# Patient Record
Sex: Female | Born: 1960 | Race: Black or African American | Hispanic: No | Marital: Single | Smoking: Never smoker
Health system: Southern US, Community
[De-identification: ages and names within clinical notes are randomized; demographics above are authoritative.]

## PROBLEM LIST (undated history)

## (undated) DIAGNOSIS — N643 Galactorrhea not associated with childbirth: Secondary | ICD-10-CM

## (undated) DIAGNOSIS — M1990 Unspecified osteoarthritis, unspecified site: Secondary | ICD-10-CM

## (undated) DIAGNOSIS — I639 Cerebral infarction, unspecified: Secondary | ICD-10-CM

## (undated) DIAGNOSIS — N644 Mastodynia: Secondary | ICD-10-CM

## (undated) HISTORY — DX: Mastodynia: N64.4

## (undated) HISTORY — DX: Cerebral infarction, unspecified: I63.9

## (undated) HISTORY — DX: Galactorrhea not associated with childbirth: N64.3

## (undated) HISTORY — DX: Unspecified osteoarthritis, unspecified site: M19.90

---

## 2001-07-31 ENCOUNTER — Other Ambulatory Visit: Admission: RE | Admit: 2001-07-31 | Discharge: 2001-07-31 | Payer: Self-pay | Admitting: Obstetrics and Gynecology

## 2001-08-07 ENCOUNTER — Encounter: Admission: RE | Admit: 2001-08-07 | Discharge: 2001-08-07 | Payer: Self-pay | Admitting: *Deleted

## 2001-08-07 ENCOUNTER — Encounter: Payer: Self-pay | Admitting: *Deleted

## 2002-05-04 HISTORY — PX: OTHER SURGICAL HISTORY: SHX169

## 2002-05-04 HISTORY — PX: HYSTEROSCOPY WITH D & C: SHX1775

## 2002-08-25 ENCOUNTER — Other Ambulatory Visit: Admission: RE | Admit: 2002-08-25 | Discharge: 2002-08-25 | Payer: Self-pay | Admitting: Obstetrics and Gynecology

## 2002-09-03 ENCOUNTER — Encounter: Payer: Self-pay | Admitting: *Deleted

## 2002-09-03 ENCOUNTER — Encounter: Admission: RE | Admit: 2002-09-03 | Discharge: 2002-09-03 | Payer: Self-pay | Admitting: *Deleted

## 2002-10-04 ENCOUNTER — Ambulatory Visit (HOSPITAL_COMMUNITY): Admission: RE | Admit: 2002-10-04 | Discharge: 2002-10-04 | Payer: Self-pay | Admitting: Obstetrics and Gynecology

## 2002-10-04 ENCOUNTER — Encounter (INDEPENDENT_AMBULATORY_CARE_PROVIDER_SITE_OTHER): Payer: Self-pay | Admitting: Specialist

## 2003-06-01 ENCOUNTER — Other Ambulatory Visit: Admission: RE | Admit: 2003-06-01 | Discharge: 2003-06-01 | Payer: Self-pay | Admitting: *Deleted

## 2003-09-19 ENCOUNTER — Encounter: Admission: RE | Admit: 2003-09-19 | Discharge: 2003-09-19 | Payer: Self-pay | Admitting: *Deleted

## 2003-09-19 ENCOUNTER — Other Ambulatory Visit: Admission: RE | Admit: 2003-09-19 | Discharge: 2003-09-19 | Payer: Self-pay | Admitting: Obstetrics and Gynecology

## 2004-01-06 ENCOUNTER — Other Ambulatory Visit: Admission: RE | Admit: 2004-01-06 | Discharge: 2004-01-06 | Payer: Self-pay | Admitting: Obstetrics and Gynecology

## 2004-10-11 ENCOUNTER — Encounter: Admission: RE | Admit: 2004-10-11 | Discharge: 2004-10-11 | Payer: Self-pay | Admitting: *Deleted

## 2004-11-19 ENCOUNTER — Other Ambulatory Visit: Admission: RE | Admit: 2004-11-19 | Discharge: 2004-11-19 | Payer: Self-pay | Admitting: Obstetrics and Gynecology

## 2004-12-03 ENCOUNTER — Encounter: Admission: RE | Admit: 2004-12-03 | Discharge: 2004-12-03 | Payer: Self-pay | Admitting: Obstetrics and Gynecology

## 2005-06-06 ENCOUNTER — Encounter: Admission: RE | Admit: 2005-06-06 | Discharge: 2005-06-06 | Payer: Self-pay | Admitting: Obstetrics and Gynecology

## 2005-11-21 ENCOUNTER — Other Ambulatory Visit: Admission: RE | Admit: 2005-11-21 | Discharge: 2005-11-21 | Payer: Self-pay | Admitting: Internal Medicine

## 2005-11-26 ENCOUNTER — Encounter: Admission: RE | Admit: 2005-11-26 | Discharge: 2005-11-26 | Payer: Self-pay | Admitting: Obstetrics & Gynecology

## 2006-05-09 ENCOUNTER — Other Ambulatory Visit: Admission: RE | Admit: 2006-05-09 | Discharge: 2006-05-09 | Payer: Self-pay | Admitting: Obstetrics & Gynecology

## 2006-11-25 ENCOUNTER — Other Ambulatory Visit: Admission: RE | Admit: 2006-11-25 | Discharge: 2006-11-25 | Payer: Self-pay | Admitting: Obstetrics and Gynecology

## 2006-12-01 ENCOUNTER — Encounter: Admission: RE | Admit: 2006-12-01 | Discharge: 2006-12-01 | Payer: Self-pay | Admitting: Obstetrics and Gynecology

## 2007-05-27 DIAGNOSIS — I639 Cerebral infarction, unspecified: Secondary | ICD-10-CM

## 2007-05-27 HISTORY — DX: Cerebral infarction, unspecified: I63.9

## 2007-06-17 ENCOUNTER — Ambulatory Visit: Payer: Self-pay | Admitting: Cardiology

## 2007-12-25 ENCOUNTER — Encounter: Admission: RE | Admit: 2007-12-25 | Discharge: 2007-12-25 | Payer: Self-pay | Admitting: Obstetrics and Gynecology

## 2007-12-25 ENCOUNTER — Other Ambulatory Visit: Admission: RE | Admit: 2007-12-25 | Discharge: 2007-12-25 | Payer: Self-pay | Admitting: Obstetrics and Gynecology

## 2008-12-27 ENCOUNTER — Encounter: Admission: RE | Admit: 2008-12-27 | Discharge: 2008-12-27 | Payer: Self-pay | Admitting: Obstetrics and Gynecology

## 2010-02-08 ENCOUNTER — Encounter
Admission: RE | Admit: 2010-02-08 | Discharge: 2010-02-08 | Payer: Self-pay | Source: Home / Self Care | Attending: Obstetrics and Gynecology | Admitting: Obstetrics and Gynecology

## 2010-03-17 ENCOUNTER — Encounter: Payer: Self-pay | Admitting: Obstetrics and Gynecology

## 2010-07-13 NOTE — Op Note (Signed)
   NAME:  Alison Castillo, Alison Castillo                         ACCOUNT NO.:  [id number]   MEDICAL RECORD NO.:  [id number]                   PATIENT TYPE:  OUT   LOCATION:  [facility]                                  FACILITY:  [facility]   PHYSICIAN:  Cynthia P. Romine, M.D.             DATE OF BIRTH:  02-06-1961   DATE OF PROCEDURE:  10/04/2002  DATE OF DISCHARGE:                                 OPERATIVE REPORT   PREOPERATIVE DIAGNOSES:  1. Menorrhagia,  2. Known endometrial polyp.  3. Known uterine fibroids.   POSTOPERATIVE DIAGNOSES:  Pending.   PROCEDURE:  Hysteroscopic resection of endometrial polyps, ________.   SURGEON:  Cynthia P. Romine, M.D.   ANESTHESIA:  General by LMA.   ESTIMATED BLOOD LOSS:  Minimal.   COMPLICATIONS:  None.   SORBITOL DEFICIT:  80 cc.   PROCEDURE:  The patient was taken to the operating room, and after the  induction of adequate general anesthesia, was placed in the dorsal lithotomy  position and prepped and draped in the usual fashion.  The bladder was  drained with a red rubber catheter.  We grasped the introitus with a single  tooth tenaculum and a uterine clamp at 5 cm with fingertip dilation up to 31  Pratt.  The operative hysteroscope was used. Sorbitol was used as a  distention medium.  The pump was set as a pressure of 80 mmHg.  Hysteroscopy  revealed the known endometrial polyp.  This was removed with a double loop  cautery without difficulty.  The remainder of the endometrial cavity  appeared clear.  The fundus, the ligaments, and the cervix were clear after  resection of the polyp.  The hysteroscope was removed.  Sharp curettage was  carried out, specimen was sent to pathology.  The hysteroscope was re-  introduced and hysteroscopy was again carried out again revealing the  endometrial cavity to be clean.  Hysteroscope was removed.  The procedure  was terminated.  The patient tolerated it well and was transferred in  satisfactory condition to  post-anesthesia recovery.                                               Cynthia P. Romine, M.D.    CPR/MEDQ  D:  10/04/2002  T:  10/04/2002  Job:  [id number]

## 2011-01-07 ENCOUNTER — Other Ambulatory Visit: Payer: Self-pay | Admitting: Obstetrics and Gynecology

## 2011-01-07 DIAGNOSIS — Z1231 Encounter for screening mammogram for malignant neoplasm of breast: Secondary | ICD-10-CM

## 2011-01-28 ENCOUNTER — Other Ambulatory Visit: Payer: Self-pay | Admitting: Family Medicine

## 2011-01-28 DIAGNOSIS — R9389 Abnormal findings on diagnostic imaging of other specified body structures: Secondary | ICD-10-CM

## 2011-02-01 ENCOUNTER — Ambulatory Visit
Admission: RE | Admit: 2011-02-01 | Discharge: 2011-02-01 | Disposition: A | Discharge status: Home / Self Care | Payer: BC Managed Care – PPO | Source: Ambulatory Visit | Attending: Family Medicine | Admitting: Family Medicine

## 2011-02-01 DIAGNOSIS — R9389 Abnormal findings on diagnostic imaging of other specified body structures: Secondary | ICD-10-CM

## 2011-02-01 MED ORDER — IOHEXOL 300 MG/ML  SOLN
75.0000 mL | Freq: Once | INTRAMUSCULAR | Status: AC | PRN
  Administered 2011-02-01: 75 mL via INTRAVENOUS

## 2011-02-13 ENCOUNTER — Other Ambulatory Visit: Payer: Self-pay | Admitting: Family Medicine

## 2011-02-13 DIAGNOSIS — E041 Nontoxic single thyroid nodule: Secondary | ICD-10-CM

## 2011-03-01 ENCOUNTER — Ambulatory Visit
Admission: RE | Admit: 2011-03-01 | Discharge: 2011-03-01 | Disposition: A | Discharge status: Home / Self Care | Payer: BC Managed Care – PPO | Source: Ambulatory Visit | Attending: Obstetrics and Gynecology | Admitting: Obstetrics and Gynecology

## 2011-03-01 DIAGNOSIS — Z1231 Encounter for screening mammogram for malignant neoplasm of breast: Secondary | ICD-10-CM

## 2012-03-02 ENCOUNTER — Other Ambulatory Visit: Payer: Self-pay | Admitting: Obstetrics and Gynecology

## 2012-03-02 DIAGNOSIS — Z1231 Encounter for screening mammogram for malignant neoplasm of breast: Secondary | ICD-10-CM

## 2012-03-25 ENCOUNTER — Ambulatory Visit: Payer: BC Managed Care – PPO

## 2012-04-24 ENCOUNTER — Ambulatory Visit: Payer: BC Managed Care – PPO

## 2012-04-24 ENCOUNTER — Ambulatory Visit
Admission: RE | Admit: 2012-04-24 | Discharge: 2012-04-24 | Disposition: A | Discharge status: Home / Self Care | Payer: BC Managed Care – PPO | Source: Ambulatory Visit | Attending: Obstetrics and Gynecology | Admitting: Obstetrics and Gynecology

## 2012-09-18 ENCOUNTER — Encounter: Payer: Self-pay | Admitting: *Deleted

## 2012-09-21 ENCOUNTER — Telehealth: Payer: Self-pay | Admitting: Nurse Practitioner

## 2012-09-21 ENCOUNTER — Encounter: Payer: Self-pay | Admitting: Nurse Practitioner

## 2012-09-21 ENCOUNTER — Ambulatory Visit (INDEPENDENT_AMBULATORY_CARE_PROVIDER_SITE_OTHER): Payer: BC Managed Care – PPO | Admitting: Nurse Practitioner

## 2012-09-21 VITALS — BP 128/72 | HR 74 | Temp 98.6°F | Resp 12 | Ht 68.0 in | Wt 167.4 lb

## 2012-09-21 DIAGNOSIS — N390 Urinary tract infection, site not specified: Secondary | ICD-10-CM

## 2012-09-21 DIAGNOSIS — R109 Unspecified abdominal pain: Secondary | ICD-10-CM

## 2012-09-21 LAB — POCT URINALYSIS DIPSTICK
Protein, UA: POSITIVE
Urobilinogen, UA: NEGATIVE
pH, UA: 6.5

## 2012-09-21 LAB — CBC
HCT: 37.5 % (ref 36.0–46.0)
Platelets: 188 10*3/uL (ref 150–400)
WBC: 6.1 10*3/uL (ref 4.0–10.5)

## 2012-09-21 MED ORDER — CIPROFLOXACIN HCL 500 MG PO TABS: 500.0000 mg | ORAL_TABLET | Freq: Two times a day (BID) | ORAL | Status: DC

## 2012-09-21 NOTE — Patient Instructions (Signed)
Urinary Tract Infection  Urinary tract infections (UTIs) can develop anywhere along your urinary tract. Your urinary tract is your body's drainage system for removing wastes and extra water. Your urinary tract includes two kidneys, two ureters, a bladder, and a urethra. Your kidneys are a pair of bean-shaped organs. Each kidney is about the size of your fist. They are located below your ribs, one on each side of your spine.  CAUSES  Infections are caused by microbes, which are microscopic organisms, including fungi, viruses, and bacteria. These organisms are so small that they can only be seen through a microscope. Bacteria are the microbes that most commonly cause UTIs.  SYMPTOMS   Symptoms of UTIs may vary by age and gender of the patient and by the location of the infection. Symptoms in young women typically include a frequent and intense urge to urinate and a painful, burning feeling in the bladder or urethra during urination. Older women and men are more likely to be tired, shaky, and weak and have muscle aches and abdominal pain. A fever may mean the infection is in your kidneys. Other symptoms of a kidney infection include pain in your back or sides below the ribs, nausea, and vomiting.  DIAGNOSIS  To diagnose a UTI, your caregiver will ask you about your symptoms. Your caregiver also will ask to provide a urine sample. The urine sample will be tested for bacteria and white blood cells. White blood cells are made by your body to help fight infection.  TREATMENT   Typically, UTIs can be treated with medication. Because most UTIs are caused by a bacterial infection, they usually can be treated with the use of antibiotics. The choice of antibiotic and length of treatment depend on your symptoms and the type of bacteria causing your infection.  HOME CARE INSTRUCTIONS   If you were prescribed antibiotics, take them exactly as your caregiver instructs you. Finish the medication even if you feel better after you  have only taken some of the medication.   Drink enough water and fluids to keep your urine clear or pale yellow.   Avoid caffeine, tea, and carbonated beverages. They tend to irritate your bladder.   Empty your bladder often. Avoid holding urine for long periods of time.   Empty your bladder before and after sexual intercourse.   After a bowel movement, women should cleanse from front to back. Use each tissue only once.  SEEK MEDICAL CARE IF:    You have back pain.   You develop a fever.   Your symptoms do not begin to resolve within 3 days.  SEEK IMMEDIATE MEDICAL CARE IF:    You have severe back pain or lower abdominal pain.   You develop chills.   You have nausea or vomiting.   You have continued burning or discomfort with urination.  MAKE SURE YOU:    Understand these instructions.   Will watch your condition.   Will get help right away if you are not doing well or get worse.  Document Released: 11/21/2004 Document Revised: 08/13/2011 Document Reviewed: 03/22/2011  Exit[employer] Patient Information 2014 [employer].

## 2012-09-21 NOTE — Telephone Encounter (Signed)
Patient was called and left a message that stat labs were normal but that if she got worse to call back.  Dr. Farrel Gobble on call this pm.

## 2012-09-21 NOTE — Progress Notes (Signed)
Subjective:     Patient ID: Alison Castillo, female   DOB: 1960/04/13, 52 y.o.   MRN: [id number] 52 yo S AA Fe with history of symptoms of urinary urgency and frequency since 7/23. No dysuria but having bladder 'spasm'. No fever or chills. Today with nausea and vomiting X 4. Normal breakfast without symptoms. Then this afternoon no appetite then had vomiting. LMP 09/06/12 flow for 4-5 days. Condoms for birth control. Denies vaginal symptoms. No history of renal calculi.  Pelvic Pain The patient's primary symptoms include pelvic pain. The patient's pertinent negatives include no vaginal discharge. Associated symptoms include abdominal pain, back pain, flank pain, frequency, nausea and vomiting. Pertinent negatives include no chills, constipation, diarrhea, dysuria or fever. She has tried nothing for the symptoms. She is sexually active. No, her partner does not have an STD. She uses condoms for contraception. Her menstrual history has been regular. (Uterine fibroids with last PUS 04/17/11 multiple fibroids.)      Review of Systems  Constitutional: Positive for appetite change and fatigue. Negative for fever and chills.  Respiratory: Negative.   Cardiovascular: Negative.   Gastrointestinal: Positive for nausea, vomiting, abdominal pain and abdominal distention. Negative for diarrhea, constipation and blood in stool.  Endocrine: Negative.   Genitourinary: Positive for frequency, flank pain and pelvic pain. Negative for dysuria, vaginal bleeding and vaginal discharge.  Musculoskeletal: Positive for back pain.  Neurological: Negative.   Psychiatric/Behavioral: Negative.        Objective:   Physical Exam  Constitutional: She is oriented to person, place, and time. She appears well-developed and well-nourished. No distress.  Cardiovascular: Normal rate.   Pulmonary/Chest: Effort normal.  Abdominal: Soft. Bowel sounds are normal. She exhibits distension. She exhibits no mass. There is no tenderness.  There is no rebound and no guarding.  Slight distention but soft.  Genitourinary:  No lesions, normal vaginal discharge. Uterus about 13 wk size. No pain on bimanual.   Unable to elicit any pain on pelvic.  Musculoskeletal: Normal range of motion.  Neurological: She is alert and oriented to person, place, and time.  Skin: She is not diaphoretic.  Psychiatric: She has a normal mood and affect. Her behavior is normal. Judgment and thought content normal.   Urine : trace RBC and + protein    Assessment:     R/O UTI N / V today will get stat CBC    Plan:     Stat CBC and call results to me Cipro 500 mg bid # 14 Urine Culture and will follow     5:45 pm stat CBC labs called to me: WBC 6.1; Hgb 12.5; platelets 188. Patient was called and not available so left a voice mail that labs were OK but if symptoms worsen to let us know that Dr. Farrel Gobble was on call. spoke to Dr. Farrel Gobble about patient.

## 2012-09-22 LAB — URINE CULTURE: Colony Count: NO GROWTH

## 2012-09-22 NOTE — Progress Notes (Signed)
Encounter reviewed by Dr. Brook Silva.  

## 2012-09-25 ENCOUNTER — Telehealth: Payer: Self-pay | Admitting: *Deleted

## 2012-09-25 NOTE — Telephone Encounter (Signed)
Pt is aware of negative urine culture results and states she feels much better and is without pain.

## 2012-09-25 NOTE — Telephone Encounter (Signed)
Message copied by Osie Bond on Fri Sep 25, 2012  9:59 AM ------      Message from: Ria Comment R      Created: Thu Sep 24, 2012  5:15 PM       Let patient know that urine culture was negative - get a progress report. ------

## 2012-12-10 ENCOUNTER — Other Ambulatory Visit: Payer: Self-pay

## 2012-12-10 DIAGNOSIS — Z1231 Encounter for screening mammogram for malignant neoplasm of breast: Secondary | ICD-10-CM

## 2013-04-26 ENCOUNTER — Ambulatory Visit: Payer: Self-pay | Admitting: Gynecology

## 2013-04-26 ENCOUNTER — Ambulatory Visit: Payer: BC Managed Care – PPO

## 2013-05-10 ENCOUNTER — Encounter: Payer: Self-pay | Admitting: Gynecology

## 2013-05-10 ENCOUNTER — Ambulatory Visit (INDEPENDENT_AMBULATORY_CARE_PROVIDER_SITE_OTHER): Payer: BC Managed Care – PPO | Admitting: Gynecology

## 2013-05-10 ENCOUNTER — Other Ambulatory Visit: Payer: Self-pay

## 2013-05-10 ENCOUNTER — Ambulatory Visit
Admission: RE | Admit: 2013-05-10 | Discharge: 2013-05-10 | Disposition: A | Discharge status: Home / Self Care | Payer: BC Managed Care – PPO | Source: Ambulatory Visit

## 2013-05-10 VITALS — BP 120/78 | HR 60 | Resp 16 | Ht 67.0 in | Wt 167.2 lb

## 2013-05-10 DIAGNOSIS — D259 Leiomyoma of uterus, unspecified: Secondary | ICD-10-CM | POA: Insufficient documentation

## 2013-05-10 DIAGNOSIS — Z0000 Encounter for general adult medical examination without abnormal findings: Secondary | ICD-10-CM

## 2013-05-10 DIAGNOSIS — Z1231 Encounter for screening mammogram for malignant neoplasm of breast: Secondary | ICD-10-CM

## 2013-05-10 DIAGNOSIS — E559 Vitamin D deficiency, unspecified: Secondary | ICD-10-CM

## 2013-05-10 DIAGNOSIS — Z01419 Encounter for gynecological examination (general) (routine) without abnormal findings: Secondary | ICD-10-CM

## 2013-05-10 LAB — POCT URINALYSIS DIPSTICK
BILIRUBIN UA: NEGATIVE
Glucose, UA: NEGATIVE
KETONES UA: NEGATIVE
Nitrite, UA: NEGATIVE
PH UA: 5
Protein, UA: NEGATIVE
UROBILINOGEN UA: NEGATIVE

## 2013-05-10 NOTE — Patient Instructions (Addendum)

## 2013-05-10 NOTE — Progress Notes (Signed)
53 y.o. Single Caucasian female   G1P0 here for annual exam. Pt reports menses are absent   She does not report hot flashes, does have night sweats, does have vaginal dryness.  She is using lubricants, OTC.  She does not report post-menopasual bleeding.  No issues from fibroids.   No dyspareunia.  Patient's last menstrual period was 04/22/2013.          Sexually active: yes  The current method of family planning is none.    Exercising: no  The patient does not participate in regular exercise at present. Last pap:  04/24/12 NEG HR Abnormal PAP: No Mammogram: 04/24/12 Bi-Rads 1 BSE: yes  Colonoscopy: 7/13 Normal  DEXA:  none Alcohol: no Tobacco: no  Hgb: 12.8 ; Urine: Leuks 1; Blood Trace  Health Maintenance  Topic Date Due  . Tetanus/tdap  04/09/1979  . Colonoscopy  04/08/2010  . Influenza Vaccine  09/25/2012  . Mammogram  04/24/2014  . Pap Smear  04/25/2015    Family History  Problem Relation Age of Onset  . Hypertension Father   . Heart failure Father   . Diabetes Maternal Aunt   . Lung cancer Maternal Aunt     There are no active problems to display for this patient.   Past Medical History  Diagnosis Date  . Galactorrhea   . CVA (cerebral vascular accident)     Past Surgical History  Procedure Laterality Date  . Hysteroscopic resection polyp      Allergies: Latex  Current Outpatient Prescriptions  Medication Sig Dispense Refill  . aspirin 81 MG tablet Take 81 mg by mouth daily.      Marland Kitchen CALCIUM PO Take by mouth. sporatic      . Cholecalciferol (VITAMIN D) 2000 UNITS tablet Take 2,000 Units by mouth daily.      . Multiple Vitamins-Minerals (MULTIVITAMIN PO) Take by mouth daily.       No current facility-administered medications for this visit.    ROS: Pertinent items are noted in HPI.  Exam:    BP 120/78  Pulse 60  Resp 16  Ht 5\' 7"  (1.702 m)  Wt 167 lb 3.2 oz (75.841 kg)  BMI 26.18 kg/m2  LMP 04/22/2013 Weight change: @WEIGHTCHANGE @ Last 3 height  recordings:  Ht Readings from Last 3 Encounters:  05/10/13 5\' 7"  (1.702 m)  09/21/12 5\' 8"  (1.727 m)   General appearance: alert, cooperative and appears stated age Head: Normocephalic, without obvious abnormality, atraumatic Neck: no adenopathy, no carotid bruit, no JVD, supple, symmetrical, trachea midline and thyroid not enlarged, symmetric, no tenderness/mass/nodules Lungs: clear to auscultation bilaterally Breasts: normal appearance, no masses or tenderness Heart: regular rate and rhythm, S1, S2 normal, no murmur, click, rub or gallop Abdomen: soft, non-tender; bowel sounds normal; no masses,  no organomegaly, uterus u-2 Extremities: extremities normal, atraumatic, no cyanosis or edema Skin: Skin color, texture, turgor normal. No rashes or lesions Lymph nodes: Cervical, supraclavicular, and axillary nodes normal. no inguinal nodes palpated Neurologic: Grossly normal   Pelvic: External genitalia:  no lesions              Urethra: normal appearing urethra with no masses, tenderness or lesions              Bartholins and Skenes: normal                 Vagina: normal appearing vagina with normal color and discharge, no lesions  Cervix: normal appearance              Pap taken: no        Bimanual Exam:  Uterus:  enlarged to 16 week's size                                      Adnexa:    no masses                                      Rectovaginal: Confirms                                      Anus:  normal sphincter tone, no lesions  A: well woman Asymptomatic fibroids     P: mammogram pap smear guidelines reviewed counseled on breast self exam, mammography screening, adequate intake of calcium and vitamin D, diet and exercise, Kegel's exercises return annually or prn Discussed PAP guideline changes, importance of weight bearing exercises, calcium, vit D and balanced diet.  An After Visit Summary was printed and given to the patient.

## 2013-05-11 LAB — VITAMIN D 25 HYDROXY (VIT D DEFICIENCY, FRACTURES): Vit D, 25-Hydroxy: 62 ng/mL (ref 30–89)

## 2013-05-11 LAB — HEMOGLOBIN, FINGERSTICK: HEMOGLOBIN, FINGERSTICK: 12.8 g/dL (ref 12.0–16.0)

## 2013-07-09 ENCOUNTER — Ambulatory Visit: Payer: Self-pay | Admitting: Obstetrics & Gynecology

## 2013-12-27 ENCOUNTER — Encounter: Payer: Self-pay | Admitting: Gynecology

## 2014-01-06 ENCOUNTER — Telehealth: Payer: Self-pay

## 2014-01-06 NOTE — Telephone Encounter (Signed)
Pt states she missed her period in Oct. Pt doesn't know if she is pregnant or going through menopause. Pt woulds like a call back form a nurse

## 2014-01-06 NOTE — Telephone Encounter (Signed)
Patient requesting OV to rule out pregnancy. Missed manses in Oct. Lighter than normal menses Nov 4 and 5. Usually very regular with menses. Previous AEX revealed possible enlarged uterus. Denies signs of pregnancy. Has had negative UPT at home but wants OV to confirm due to implications of possible pregnancy at her age. Denies hot flashes, has some night sweats. OV tomorrow with [facility]. Last AEX with Dr Charlies Constable 05-10-13.  Routing to provider for final review. Patient agreeable to disposition. Will close encounter

## 2014-01-07 ENCOUNTER — Ambulatory Visit (INDEPENDENT_AMBULATORY_CARE_PROVIDER_SITE_OTHER): Payer: BC Managed Care – PPO | Admitting: Certified Nurse Midwife

## 2014-01-07 ENCOUNTER — Encounter: Payer: Self-pay | Admitting: Certified Nurse Midwife

## 2014-01-07 VITALS — BP 110/62 | HR 68 | Resp 16 | Ht 67.0 in | Wt 166.0 lb

## 2014-01-07 DIAGNOSIS — N951 Menopausal and female climacteric states: Secondary | ICD-10-CM

## 2014-01-07 DIAGNOSIS — Z0000 Encounter for general adult medical examination without abnormal findings: Secondary | ICD-10-CM

## 2014-01-07 DIAGNOSIS — D259 Leiomyoma of uterus, unspecified: Secondary | ICD-10-CM

## 2014-01-07 DIAGNOSIS — N852 Hypertrophy of uterus: Secondary | ICD-10-CM

## 2014-01-07 DIAGNOSIS — N912 Amenorrhea, unspecified: Secondary | ICD-10-CM

## 2014-01-07 DIAGNOSIS — E042 Nontoxic multinodular goiter: Secondary | ICD-10-CM

## 2014-01-07 DIAGNOSIS — IMO0002 Reserved for concepts with insufficient information to code with codable children: Secondary | ICD-10-CM

## 2014-01-07 LAB — POCT URINE PREGNANCY: PREG TEST UR: NEGATIVE

## 2014-01-07 NOTE — Progress Notes (Signed)
53 y.o. married african [place] female g1p0 here with complaint of no menses in 10/15, but prior all regular periods in the year. Recent LMP 11/6-7/15 was only spotting. Patient complaining of night sweats, but no hot flashes. Sexually active, but broke up with partner, so ? Anxious feeling. Contraception is condoms. NO STD concerns or testing needed. Patient does have history of fibroids, PUS noted in chart 10-11 week size in 2013. Home UPT negative. Patient was concerned about pregnancy. No other health issues today.  O:Healthy female WDWN Affect: normal, orientation x 3  Exam:Skin warm and dry Neck: Thyroid: multi small nodules noted, minimal enlargement Abdomen:soft, enlarged with mass Lymph node: no enlargement or tenderness Pelvic exam: External genital: normal female, no lesions BUS: negative Vagina: normal physiological discharge noted. No blood present Cervix: normal, non tender Uterus: Enlarged 16-18 week size non tender, slightly firm, fibroids palpated anterior to cervix also Adnexa:not palpable no large masses noted   A:Perimenopausal? menopausal History of fibroids with uterine size change Symptomatic fibroids with urinary frequency and pressure only, no UTI symptoms Thyroid: multi nodular   P:Discussed findings of normal pelvic exam except for known enlarged uterus due to fibroids with significant size change. Discussed bleeding can change with fibroids and with perimenopause/menopause. Discussed PUS needed for evaluation, due to unable to feel ovaries also. Patient agreeable to scheduling. Also discussed the pressure cessation that she has had for months,probably attributed to uterine size. Discussed etiology of perimenopausal and bleeding expectations. Questions addressed. Patient does not want to be pregnant. Discussed likelihood small due to fibroid, but will do AMH to determine fertility status also. Lab:FSH,TSH with panel, Prolactin, AMH Discussed multinodular thyroid  and effect of cycles also. May need [place] but will do labs first. Given menses calendar to record any other bleeding.  Rv as above, prn

## 2014-01-07 NOTE — Patient Instructions (Signed)

## 2014-01-08 LAB — FOLLICLE STIMULATING HORMONE: FSH: 7.5 m[IU]/mL

## 2014-01-08 LAB — PROLACTIN: PROLACTIN: 11.8 ng/mL

## 2014-01-08 LAB — THYROID PANEL WITH TSH
Free Thyroxine Index: 2.1 (ref 1.4–3.8)
T3 UPTAKE: 25 % (ref 22–35)
T4 TOTAL: 8.3 ug/dL (ref 4.5–12.0)
TSH: 1.148 u[IU]/mL (ref 0.350–4.500)

## 2014-01-09 NOTE — Progress Notes (Signed)
Reviewed personally.  M. Suzanne Dean Wonder, MD.  

## 2014-01-10 ENCOUNTER — Telehealth: Payer: Self-pay | Admitting: Certified Nurse Midwife

## 2014-01-10 LAB — ANTI MULLERIAN HORMONE

## 2014-01-10 NOTE — Telephone Encounter (Signed)
Spoke with patient. Advised that she will be responsible for a $30 copay when she comes in for PUS. Patient agreeable. Patient will call back later today to schedule.

## 2014-01-11 ENCOUNTER — Telehealth: Payer: Self-pay

## 2014-01-11 DIAGNOSIS — D259 Leiomyoma of uterus, unspecified: Secondary | ICD-10-CM

## 2014-01-11 DIAGNOSIS — N852 Hypertrophy of uterus: Secondary | ICD-10-CM

## 2014-01-11 NOTE — Telephone Encounter (Signed)
Left message to call [facility] at [phone].  Patient needs to be scheduled for PUS appointment. Patient can be seen on Tuesday 11/24 or 12/22 or any open Thursday.

## 2014-01-11 NOTE — Telephone Encounter (Signed)
lmtcb

## 2014-01-11 NOTE — Telephone Encounter (Signed)
-----   Message from Regina Eck, CNM sent at 01/11/2014  6:28 AM EST ----- Notify patient that her [facility] is not menopausal yet, she is experiencing perimenopausal changes, keep menses calendar as discussed, advise if no menses in 3 months AMH shows infertility, so no concerns for pregnancy now Prolactin is normal and Thyroid panel is normal  Order in for PUS she will be called and scheduled

## 2014-01-13 NOTE — Telephone Encounter (Signed)
Left message for call back.

## 2014-01-14 NOTE — Telephone Encounter (Signed)
Pre-cert complete/pr $73 copay.  Kaitlyn, Please enter new order under Dr Sabra Heck. Thanks!

## 2014-01-14 NOTE — Telephone Encounter (Signed)
Spoke with patient. Results given as seen below. Patient is agreeable and verbalizes understanding. Patient requesting Tuesday appointment for PUS.PUS appointment scheduled for 12/22 at 1pm with 1:30pm consult with Dr.Miller. Patient is agreeable to date and time.  Cc: Felipa Emory for precert Cc: Regina Eck CNM   Routing to provider for final review. Patient agreeable to disposition. Will close encounter

## 2014-01-14 NOTE — Telephone Encounter (Signed)
New order placed for PUS under Dr.Miller.

## 2014-01-14 NOTE — Addendum Note (Signed)
Addended by: Rolla Etienne E on: 01/14/2014 01:08 PM   Modules accepted: Orders

## 2014-01-25 ENCOUNTER — Telehealth: Payer: Self-pay

## 2014-01-25 NOTE — Telephone Encounter (Signed)
lmtcb to reschedule AEX with Dr. Lathrop 

## 2014-02-15 ENCOUNTER — Ambulatory Visit (INDEPENDENT_AMBULATORY_CARE_PROVIDER_SITE_OTHER): Payer: BC Managed Care – PPO

## 2014-02-15 ENCOUNTER — Other Ambulatory Visit: Payer: Self-pay | Admitting: Obstetrics & Gynecology

## 2014-02-15 ENCOUNTER — Ambulatory Visit (INDEPENDENT_AMBULATORY_CARE_PROVIDER_SITE_OTHER): Payer: BC Managed Care – PPO | Admitting: Obstetrics & Gynecology

## 2014-02-15 VITALS — BP 102/62 | Ht 67.0 in | Wt 163.0 lb

## 2014-02-15 DIAGNOSIS — Z8673 Personal history of transient ischemic attack (TIA), and cerebral infarction without residual deficits: Secondary | ICD-10-CM

## 2014-02-15 DIAGNOSIS — D251 Intramural leiomyoma of uterus: Secondary | ICD-10-CM

## 2014-02-15 DIAGNOSIS — D259 Leiomyoma of uterus, unspecified: Secondary | ICD-10-CM

## 2014-02-15 DIAGNOSIS — N852 Hypertrophy of uterus: Secondary | ICD-10-CM

## 2014-02-15 NOTE — Progress Notes (Signed)
53 y.o.Singlefemale here for a pelvic ultrasound due to uterine fibroids.  Pt has been aware of fibroids over last three years.  Initial PUS done 2/13.  This year with physical exam, question of change in size of uterus noted.  Pt here for further evaluation.  Also, cycles have begun to change a little this year.  [place] wa 7.5 but AMH <0.03.  Pt's last cycle was normal for her about 5 days with flow was not particularly heavy.  No LMP recorded.  Sexually active:  yes  Contraception: no method  FINDINGS: UTERUS: 13.0 x 9.5 x 9.5cm (similar to prior scan in 2013).  Multiple fibroids noted.  Largest 6.5 x 5.3cm and 6.0 x 3.7cm but several other 3 and 2cm fibroids EMS: 5.8mm ADNEXA:   Left ovary 2.4 x 2.0 x 1.9cm with 1.1cm left ovarian follicle  Right ovary 1.9 x 1.2 x 1.4cm CUL DE SAC:  Pt really not interested in surgery if possible.  Long standing hx of fibroids with findings on imaging noted back as far as 2004.  (This was obtained when initial exam here noted fibroids).  [place] and AMH do not fully agree but she does appear on ultrasound to have some hormonal function, at least from the left ovary so bleeding/cycles in light of all of this does not seem inappropriate.  Pt aware she needs to let me know if she has any long or heavy cycles or if she has prolonged amenorrhea for >61months.  As well, if cycles become less than every 3 weeks, she needs to let me know as well.  Pt voices clear understanding.  Assessment:  Enlarged uterus and asymptomatic uterine fibroids Plan: Will continue with conservative management.  Pt aware of reasons above to call.   Will see me in spring for AEX.  ~15 minutes spent with patient >50% of time was in face to face discussion of above.

## 2014-02-16 ENCOUNTER — Encounter: Payer: Self-pay | Admitting: Obstetrics & Gynecology

## 2014-02-16 DIAGNOSIS — Z8673 Personal history of transient ischemic attack (TIA), and cerebral infarction without residual deficits: Secondary | ICD-10-CM | POA: Insufficient documentation

## 2014-03-10 ENCOUNTER — Telehealth: Payer: Self-pay | Admitting: Nurse Practitioner

## 2014-03-10 NOTE — Telephone Encounter (Signed)
Left message to reschedule aex.

## 2014-04-12 ENCOUNTER — Other Ambulatory Visit: Payer: Self-pay

## 2014-04-12 DIAGNOSIS — Z1231 Encounter for screening mammogram for malignant neoplasm of breast: Secondary | ICD-10-CM

## 2014-04-29 ENCOUNTER — Ambulatory Visit: Payer: BC Managed Care – PPO | Admitting: Obstetrics & Gynecology

## 2014-05-13 ENCOUNTER — Ambulatory Visit: Payer: BC Managed Care – PPO | Admitting: Nurse Practitioner

## 2014-05-13 ENCOUNTER — Ambulatory Visit: Payer: BC Managed Care – PPO | Admitting: Gynecology

## 2014-05-16 ENCOUNTER — Ambulatory Visit: Payer: BC Managed Care – PPO

## 2014-05-17 ENCOUNTER — Encounter: Payer: Self-pay | Admitting: Nurse Practitioner

## 2014-05-17 ENCOUNTER — Ambulatory Visit (INDEPENDENT_AMBULATORY_CARE_PROVIDER_SITE_OTHER): Payer: BC Managed Care – PPO | Admitting: Nurse Practitioner

## 2014-05-17 ENCOUNTER — Ambulatory Visit
Admission: RE | Admit: 2014-05-17 | Discharge: 2014-05-17 | Disposition: A | Discharge status: Home / Self Care | Payer: BC Managed Care – PPO | Source: Ambulatory Visit

## 2014-05-17 ENCOUNTER — Other Ambulatory Visit: Payer: Self-pay

## 2014-05-17 VITALS — BP 100/62 | HR 68 | Ht 67.0 in | Wt 166.0 lb

## 2014-05-17 DIAGNOSIS — Z0000 Encounter for general adult medical examination without abnormal findings: Secondary | ICD-10-CM | POA: Diagnosis not present

## 2014-05-17 DIAGNOSIS — R8290 Unspecified abnormal findings in urine: Secondary | ICD-10-CM

## 2014-05-17 DIAGNOSIS — Z01419 Encounter for gynecological examination (general) (routine) without abnormal findings: Secondary | ICD-10-CM

## 2014-05-17 DIAGNOSIS — Z113 Encounter for screening for infections with a predominantly sexual mode of transmission: Secondary | ICD-10-CM

## 2014-05-17 DIAGNOSIS — E559 Vitamin D deficiency, unspecified: Secondary | ICD-10-CM

## 2014-05-17 DIAGNOSIS — Z1231 Encounter for screening mammogram for malignant neoplasm of breast: Secondary | ICD-10-CM

## 2014-05-17 DIAGNOSIS — D509 Iron deficiency anemia, unspecified: Secondary | ICD-10-CM | POA: Diagnosis not present

## 2014-05-17 LAB — POCT URINALYSIS DIPSTICK
Bilirubin, UA: NEGATIVE
GLUCOSE UA: NEGATIVE
KETONES UA: NEGATIVE
Nitrite, UA: NEGATIVE
Protein, UA: NEGATIVE
Urobilinogen, UA: NEGATIVE
pH, UA: 5

## 2014-05-17 LAB — IBC PANEL
%SAT: 40 % (ref 20–55)
TIBC: 420 ug/dL (ref 250–470)
UIBC: 251 ug/dL (ref 125–400)

## 2014-05-17 LAB — CBC
HCT: 38 % (ref 36.0–46.0)
HEMOGLOBIN: 12.1 g/dL (ref 12.0–15.0)
MCH: 26.2 pg (ref 26.0–34.0)
MCHC: 31.8 g/dL (ref 30.0–36.0)
MCV: 82.4 fL (ref 78.0–100.0)
MPV: 9.9 fL (ref 8.6–12.4)
Platelets: 197 10*3/uL (ref 150–400)
RBC: 4.61 MIL/uL (ref 3.87–5.11)
RDW: 14.6 % (ref 11.5–15.5)
WBC: 3.8 10*3/uL — ABNORMAL LOW (ref 4.0–10.5)

## 2014-05-17 LAB — FERRITIN: Ferritin: 30 ng/mL (ref 10–291)

## 2014-05-17 LAB — IRON: IRON: 169 ug/dL — AB (ref 42–145)

## 2014-05-17 LAB — HEMOGLOBIN, FINGERSTICK: Hemoglobin, fingerstick: 12 g/dL (ref 12.0–16.0)

## 2014-05-17 NOTE — Progress Notes (Signed)
Patient ID: Alison Castillo, female   DOB: [birth year], 54 y.o.   MRN: [id number] 54 y.o. G1P0010 Single  African American Fe here for annual exam.  Last year started skipping menses at every month to every other month.  Cycles only lasted for 3 days with moderate to light flow.  She also had regular PMS symptoms.   Then this year  in January and February menses was normal.  Now in March, menses started on 3/6 for 5 days.  2 weeks later spotting started on 3/15 to present.  Still some PMS symptoms.  She is concerned about abnormal bleeding but states this is not a lot, sometimes only with wiping.  Ended last relationship about 4 months ago abut now back together soon after.  Unsure if another partner for him.  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   Patient's last menstrual period was 05/01/2014.          Sexually active: Yes.    The current method of family planning is condoms most of the time.    Exercising: No.  The patient does not participate in regular exercise at present. Smoker:  no  Health Maintenance: Pap:  04/24/12, negative with neg HR HPV MMG:  05/17/14, Bi-Rads 1:  Negative Colonoscopy:  09/12/11, no polyps, repeat in 10 years BMD:   Never  TDaP:  11/25/06  Labs:  HB:  12.0  Urine:  Trace RBC, trace leuk's   reports that she has never smoked. She has never used smokeless tobacco. She reports that she does not drink alcohol or use illicit drugs.  Past Medical History  Diagnosis Date  . Galactorrhea   . CVA (cerebral vascular accident) 05/2007    Past Surgical History  Procedure Laterality Date  . Hysteroscopic resection polyp N/A 05/04/2002    Current Outpatient Prescriptions  Medication Sig Dispense Refill  . aspirin EC 81 MG tablet Take 81 mg by mouth daily.    . Cholecalciferol (VITAMIN D) 2000 UNITS tablet Take 2,000 Units by mouth daily.     No current facility-administered medications for  this visit.    Family History  Problem Relation Age of Onset  . Hypertension Father   . Heart failure Father   . Diabetes Maternal Aunt   . Lung cancer Maternal Aunt     ROS:  Pertinent items are noted in HPI.  Otherwise, a comprehensive ROS was negative.  Exam:   BP 100/62 mmHg  Pulse 68  Ht 5\' 7"  (1.702 m)  Wt 166 lb (75.297 kg)  BMI 25.99 kg/m2  LMP 05/01/2014 Height: 5\' 7"  (170.2 cm) Ht Readings from Last 3 Encounters:  05/17/14 5\' 7"  (1.702 m)  02/15/14 5\' 7"  (1.702 m)  01/07/14 5\' 7"  (1.702 m)    General appearance: alert, cooperative and appears stated age Head: Normocephalic, without obvious abnormality, atraumatic Neck: no adenopathy, supple, symmetrical, trachea midline and thyroid normal to inspection and palpation Lungs: clear to auscultation bilaterally Breasts: normal appearance, no masses or tenderness Heart: regular rate and rhythm Abdomen: soft, non-tender; no masses,  no organomegaly Extremities: extremities normal, atraumatic, no cyanosis or edema Skin: Skin color, texture, turgor normal. No rashes or lesions Lymph nodes: Cervical, supraclavicular, and axillary nodes normal. No abnormal inguinal nodes palpated Neurologic: Grossly normal   Pelvic: External genitalia:  no lesions              Urethra:  normal  appearing urethra with no masses, tenderness or lesions              Bartholin's and Skene's: normal                 Vagina: normal appearing vagina with normal color and minimal amount of Dhillon vaginal discharge, no lesions              Cervix: anteverted              Pap taken: Yes.   Bimanual Exam:  Uterus:  enlarged, 12 - 14  weeks size              Adnexa: no mass, fullness, tenderness               Rectovaginal: Confirms               Anus:  normal sphincter tone, no lesions  Chaperone present:  yes  A:  Well Woman with normal exam  Uterine fibroids - last PUS 02/15/14 with Dr. Sabra Heck  Recent AUB and history of anemia - request iron  studies  History of CVA on OCP 05/2007  R/O UTI  R/O STD's   P:   Reviewed health and wellness pertinent to exam  Pap smear taken today  Mammogram is due 3/17  Discussed use of Provera 10 mg for 10 days to reduce AUB - she does not want to take any hormones due to CVA risk.  Explained that she should not take estrogen.  For now wants to watch and wait and see how she does on her own.  If bleeding gets worse will call back.  If with her next cycle still bleeding afterwards to call back.  Counseled on breast self exam, mammography screening, STD prevention, adequate intake of calcium and vitamin D, diet and exercise return annually or prn  An After Visit Summary was printed and given to the patient.

## 2014-05-17 NOTE — Patient Instructions (Addendum)

## 2014-05-18 LAB — URINE CULTURE
Colony Count: NO GROWTH
Organism ID, Bacteria: NO GROWTH

## 2014-05-18 LAB — URINALYSIS, MICROSCOPIC ONLY
BACTERIA UA: NONE SEEN
CASTS: NONE SEEN
Crystals: NONE SEEN
Squamous Epithelial / LPF: NONE SEEN

## 2014-05-18 LAB — STD PANEL
HIV 1&2 Ab, 4th Generation: NONREACTIVE
Hepatitis B Surface Ag: NEGATIVE

## 2014-05-18 LAB — VITAMIN D 25 HYDROXY (VIT D DEFICIENCY, FRACTURES): Vit D, 25-Hydroxy: 40 ng/mL (ref 30–100)

## 2014-05-19 LAB — IPS PAP TEST WITH HPV

## 2014-05-19 LAB — IPS N GONORRHOEA AND CHLAMYDIA BY PCR

## 2014-05-25 ENCOUNTER — Other Ambulatory Visit: Payer: Self-pay | Admitting: Nurse Practitioner

## 2014-05-25 DIAGNOSIS — R899 Unspecified abnormal finding in specimens from other organs, systems and tissues: Secondary | ICD-10-CM

## 2014-05-25 NOTE — Addendum Note (Signed)
Addended by: Megan Salon on: 05/25/2014 01:54 PM   Modules accepted: Miquel Dunn

## 2014-05-25 NOTE — Progress Notes (Signed)
Reviewed personally.  M. Suzanne Jamesia Linnen, MD.  

## 2014-05-26 ENCOUNTER — Telehealth: Payer: Self-pay | Admitting: Nurse Practitioner

## 2014-05-26 NOTE — Telephone Encounter (Signed)
Discussed with patient that I have reviewed her concerns with Dr. Sabra Heck regarding AUB.  She now states the spotting has stopped 2 days ago.  She was interested in the name of medication we were going to use so she could look up the medication and know about this for the future.  She will continue to monitor an d keep [place] informed.

## 2014-07-15 ENCOUNTER — Ambulatory Visit: Payer: BC Managed Care – PPO | Admitting: Obstetrics & Gynecology

## 2015-03-07 ENCOUNTER — Encounter: Payer: Self-pay | Admitting: Obstetrics & Gynecology

## 2015-03-07 ENCOUNTER — Ambulatory Visit (INDEPENDENT_AMBULATORY_CARE_PROVIDER_SITE_OTHER): Payer: BC Managed Care – PPO | Admitting: Obstetrics & Gynecology

## 2015-03-07 VITALS — BP 100/68 | HR 60 | Resp 14 | Ht 67.0 in | Wt 157.0 lb

## 2015-03-07 DIAGNOSIS — Z801 Family history of malignant neoplasm of trachea, bronchus and lung: Secondary | ICD-10-CM

## 2015-03-07 DIAGNOSIS — N644 Mastodynia: Secondary | ICD-10-CM

## 2015-03-07 MED ORDER — ASPIRIN 325 MG PO TBEC: 325.0000 mg | DELAYED_RELEASE_TABLET | Freq: Every day | ORAL | Status: DC

## 2015-03-07 NOTE — Progress Notes (Signed)
Subjective:     Patient ID: Alison Castillo, female   DOB: June 16, 1960, 55 y.o.   MRN: [id number]  HPI 55 yo G1P0 SAA female here for complaint of breast pain that started around Christmas.  This has persisted.  She reports she felt like she was "more hormonal" last month.  Cycle started 03/03/15.  She is still having regular cycles.  She admits to having more caffeine over the past several weeks due to the Christmas holiday.    Last MMG was 05/17/14 and was negative.  She did a 3D at that time.  Pt reports she tried to call for imaging but was advised she needed to be seen first and now that she is here, she is glad because she already "feels better" about the pain and her concerns/worry.    Review of Systems  All other systems reviewed and are negative.      Objective:   Physical Exam  Constitutional: She appears well-developed and well-nourished.  Neck: Normal range of motion. Neck supple. No thyromegaly present.  Pulmonary/Chest: Right breast exhibits no inverted nipple, no mass, no nipple discharge and no skin change. Tenderness: diffuse. Left breast exhibits tenderness (diffuse). Left breast exhibits no inverted nipple, no mass, no nipple discharge and no skin change. Breasts are symmetrical.  Lymphadenopathy:    She has no cervical adenopathy.    She has no axillary adenopathy.       Assessment:     Diffuse breast tenderness, no focal finding on physical exam Increased caffeine intake the last few weeks    Plan:     Pt will stop caffeine use AEX will be changed to me and in about 6-8 weeks for follow up breast exam.  If pain still present then, will plan diagnostic imaging to coincide with her screening. Anti-inflammatories encouraged for pain Pt agrees with plan and is comfortable.  Advised to please call back if she starts to worry.

## 2015-05-02 ENCOUNTER — Other Ambulatory Visit: Payer: Self-pay

## 2015-05-02 DIAGNOSIS — Z1231 Encounter for screening mammogram for malignant neoplasm of breast: Secondary | ICD-10-CM

## 2015-05-10 ENCOUNTER — Telehealth: Payer: Self-pay | Admitting: Emergency Medicine

## 2015-05-10 NOTE — Telephone Encounter (Signed)
Patient in imaging hold for chest xray to be completed at [facility].  Patient has annual exam with Dr. Sabra Heck 05/22/15.  Patient states she plans to have chest xray completed next week while she is on Spring Break. Advised to call back with any concerns and patient agreeable.  Routing to provider for final review. Patient agreeable to disposition. Will close encounter.

## 2015-05-18 ENCOUNTER — Ambulatory Visit
Admission: RE | Admit: 2015-05-18 | Discharge: 2015-05-18 | Disposition: A | Discharge status: Home / Self Care | Payer: BC Managed Care – PPO | Source: Ambulatory Visit

## 2015-05-18 DIAGNOSIS — Z1231 Encounter for screening mammogram for malignant neoplasm of breast: Secondary | ICD-10-CM

## 2015-05-22 ENCOUNTER — Other Ambulatory Visit: Payer: Self-pay | Admitting: Obstetrics & Gynecology

## 2015-05-22 ENCOUNTER — Ambulatory Visit (INDEPENDENT_AMBULATORY_CARE_PROVIDER_SITE_OTHER): Payer: BC Managed Care – PPO | Admitting: Obstetrics & Gynecology

## 2015-05-22 ENCOUNTER — Encounter: Payer: Self-pay | Admitting: Obstetrics & Gynecology

## 2015-05-22 VITALS — BP 108/56 | HR 70 | Resp 16 | Ht 67.25 in | Wt 158.0 lb

## 2015-05-22 DIAGNOSIS — N926 Irregular menstruation, unspecified: Secondary | ICD-10-CM | POA: Diagnosis not present

## 2015-05-22 DIAGNOSIS — D251 Intramural leiomyoma of uterus: Secondary | ICD-10-CM

## 2015-05-22 DIAGNOSIS — Z01419 Encounter for gynecological examination (general) (routine) without abnormal findings: Secondary | ICD-10-CM

## 2015-05-22 DIAGNOSIS — R928 Other abnormal and inconclusive findings on diagnostic imaging of breast: Secondary | ICD-10-CM

## 2015-05-22 DIAGNOSIS — Z0000 Encounter for general adult medical examination without abnormal findings: Secondary | ICD-10-CM

## 2015-05-22 LAB — CBC
HCT: 39.2 % (ref 36.0–46.0)
HEMOGLOBIN: 13 g/dL (ref 12.0–15.0)
MCH: 27.6 pg (ref 26.0–34.0)
MCHC: 33.2 g/dL (ref 30.0–36.0)
MCV: 83.2 fL (ref 78.0–100.0)
MPV: 9.8 fL (ref 8.6–12.4)
Platelets: 198 10*3/uL (ref 150–400)
RBC: 4.71 MIL/uL (ref 3.87–5.11)
RDW: 14.3 % (ref 11.5–15.5)
WBC: 4.2 10*3/uL (ref 4.0–10.5)

## 2015-05-22 LAB — POCT URINE PREGNANCY: PREG TEST UR: NEGATIVE

## 2015-05-22 NOTE — Progress Notes (Signed)
55 y.o. G1P0010 SingleAfrican AmericanF here for annual exam.  Pt doing well.  Reports she stopped drinking caffeine and her breast pain is completely resolved.  She knows to stay away from this but she does like chocolate.    Pt still having menstrual cycles but she is skipping cycles.  Reports the flow in march was similar but there was more cramping.    PCP:  Dr. Addison Lank.  Last visit was 3-4 years ago.    Patient's last menstrual period was 05/14/2015.          Sexually active: Yes.    The current method of family planning is condoms sometimes.    Exercising: No.  The patient does not participate in regular exercise at present. Smoker:  no  Health Maintenance: Pap:  05/17/14 Neg. HR HPV:neg History of abnormal Pap:  no MMG:  05/19/15 BIRADS0:Incomplete. Diagnostic and [place] pending Colonoscopy:  08/2011 Normal - repeat 10 years  BMD:   Never TDaP:  10/2006 Screening Labs: PCP, Urine today: PCP   reports that she has never smoked. She has never used smokeless tobacco. She reports that she does not drink alcohol or use illicit drugs.  Past Medical History  Diagnosis Date  . Galactorrhea   . CVA (cerebral vascular accident) ([place]) 05/2007  . Breast pain, left     Since 02/2015    Past Surgical History  Procedure Laterality Date  . Hysteroscopic resection polyp N/A 05/04/2002    Current Outpatient Prescriptions  Medication Sig Dispense Refill  . aspirin 81 MG tablet Take 81 mg by mouth daily.    . Cholecalciferol (VITAMIN D) 2000 UNITS tablet Take 2,000 Units by mouth daily.     No current facility-administered medications for this visit.    Family History  Problem Relation Age of Onset  . Hypertension Father   . Heart failure Father   . Diabetes Maternal Aunt   . Lung cancer Maternal Aunt     ROS:  Pertinent items are noted in HPI.  Otherwise, a comprehensive ROS was negative.  Exam:   BP 108/56 mmHg  Pulse 70  Resp 16  Ht 5' 7.25" (1.708 m)  Wt 158 lb (71.668 kg)   BMI 24.57 kg/m2  LMP 05/14/2015  Weight change: -8#  Height: 5' 7.25" (170.8 cm)  Ht Readings from Last 3 Encounters:  05/22/15 5' 7.25" (1.708 m)  03/07/15 5\' 7"  (1.702 m)  05/17/14 5\' 7"  (1.702 m)    General appearance: alert, cooperative and appears stated age Head: Normocephalic, without obvious abnormality, atraumatic Neck: no adenopathy, supple, symmetrical, trachea midline and thyroid normal to inspection and palpation Lungs: clear to auscultation bilaterally Breasts: normal appearance, no masses or tenderness Heart: regular rate and rhythm Abdomen: soft, non-tender; bowel sounds normal; no masses,  no organomegaly Extremities: extremities normal, atraumatic, no cyanosis or edema Skin: Skin color, texture, turgor normal. No rashes or lesions Lymph nodes: Cervical, supraclavicular, and axillary nodes normal. No abnormal inguinal nodes palpated Neurologic: Grossly normal   Pelvic: External genitalia:  no lesions              Urethra:  normal appearing urethra with no masses, tenderness or lesions              Bartholins and Skenes: normal                 Vagina: normal appearing vagina with normal color and discharge, no lesions  Cervix: no lesions              Pap taken: No. Bimanual Exam:  Uterus:  Enlarged uterus with multiple fibroids, mobile but heavy, nodular.  14-15 weeks size.               Adnexa: no adnexal masses palpated               Rectovaginal: Confirms               Anus:  normal sphincter tone, no lesions  Chaperone was present for exam.  A:  Well Woman with normal exam Uterine fibroids - last PUS 02/15/14 with uterus measuring 13cm x 10 x 10cm H/O menorrhagia with anemia.  Hb 12.1 one year ago. History of CVA on OCP 05/2007 Breast tenderness resolved with decreased caffeine H/O lung cancer in her mother Mild SUI  P: Mammogram yearly Neg pap 2016.  No pap today CBC today.  If anemic, will add iron studies. CMP, Lipids, TSH, Vit  D Consider CXR in future due to pt's concern about lung cancer. AEX 1 year or follow up prn

## 2015-05-23 LAB — LIPID PANEL
CHOL/HDL RATIO: 3.6 ratio (ref <=5.0)
Cholesterol: 209 mg/dL — ABNORMAL HIGH (ref 125–200)
HDL: 58 mg/dL (ref >=46)
LDL Cholesterol: 127 mg/dL (ref <130)
TRIGLYCERIDES: 120 mg/dL (ref <150)
VLDL: 24 mg/dL (ref <30)

## 2015-05-23 LAB — COMPREHENSIVE METABOLIC PANEL
ALBUMIN: 4.3 g/dL (ref 3.6–5.1)
ALK PHOS: 67 U/L (ref 33–130)
ALT: 9 U/L (ref 6–29)
AST: 20 U/L (ref 10–35)
BILIRUBIN TOTAL: 0.3 mg/dL (ref 0.2–1.2)
BUN: 10 mg/dL (ref 7–25)
CHLORIDE: 103 mmol/L (ref 98–110)
CO2: 26 mmol/L (ref 20–31)
CREATININE: 0.76 mg/dL (ref 0.50–1.05)
Calcium: 9.5 mg/dL (ref 8.6–10.4)
Glucose, Bld: 72 mg/dL (ref 65–99)
Potassium: 3.8 mmol/L (ref 3.5–5.3)
SODIUM: 137 mmol/L (ref 135–146)
TOTAL PROTEIN: 8 g/dL (ref 6.1–8.1)

## 2015-05-23 LAB — TSH: TSH: 0.78 m[IU]/L

## 2015-06-02 ENCOUNTER — Ambulatory Visit
Admission: RE | Admit: 2015-06-02 | Discharge: 2015-06-02 | Disposition: A | Discharge status: Home / Self Care | Payer: BC Managed Care – PPO | Source: Ambulatory Visit | Attending: Obstetrics & Gynecology | Admitting: Obstetrics & Gynecology

## 2015-06-02 ENCOUNTER — Other Ambulatory Visit: Payer: BC Managed Care – PPO

## 2015-06-02 DIAGNOSIS — R928 Other abnormal and inconclusive findings on diagnostic imaging of breast: Secondary | ICD-10-CM

## 2015-06-06 ENCOUNTER — Other Ambulatory Visit: Payer: Self-pay | Admitting: Obstetrics & Gynecology

## 2015-06-08 ENCOUNTER — Telehealth: Payer: Self-pay | Admitting: Obstetrics & Gynecology

## 2015-06-08 NOTE — Telephone Encounter (Addendum)
Patient is returning a call to [place].Patient states she does not have Internet access at home. She cannot access MYCHART please return a call to her to give her any results or instructions.

## 2015-06-08 NOTE — Telephone Encounter (Signed)
Patient said she is returning a call. No open telephone notes. Patient thinks its about her recent MMG result.

## 2015-06-08 NOTE — Telephone Encounter (Signed)
Pt notified of results. Verbalized understanding.

## 2015-07-27 ENCOUNTER — Ambulatory Visit: Payer: BC Managed Care – PPO | Admitting: Obstetrics & Gynecology

## 2016-02-26 DIAGNOSIS — M1990 Unspecified osteoarthritis, unspecified site: Secondary | ICD-10-CM

## 2016-02-26 HISTORY — DX: Unspecified osteoarthritis, unspecified site: M19.90

## 2016-04-29 ENCOUNTER — Other Ambulatory Visit: Payer: Self-pay | Admitting: Obstetrics & Gynecology

## 2016-04-29 DIAGNOSIS — Z1231 Encounter for screening mammogram for malignant neoplasm of breast: Secondary | ICD-10-CM

## 2016-05-27 ENCOUNTER — Ambulatory Visit
Admission: RE | Admit: 2016-05-27 | Discharge: 2016-05-27 | Disposition: A | Discharge status: Home / Self Care | Payer: BC Managed Care – PPO | Source: Ambulatory Visit | Attending: Obstetrics & Gynecology | Admitting: Obstetrics & Gynecology

## 2016-05-27 DIAGNOSIS — Z1231 Encounter for screening mammogram for malignant neoplasm of breast: Secondary | ICD-10-CM

## 2016-08-21 ENCOUNTER — Ambulatory Visit: Payer: BC Managed Care – PPO | Admitting: Obstetrics and Gynecology

## 2016-09-03 ENCOUNTER — Encounter: Payer: Self-pay | Admitting: Obstetrics and Gynecology

## 2016-09-03 ENCOUNTER — Ambulatory Visit: Payer: BC Managed Care – PPO | Admitting: Obstetrics & Gynecology

## 2016-09-03 NOTE — Progress Notes (Deleted)
Patient ID: Alison Castillo, female   DOB: 09/05/1960, 56 y.o.   MRN: [id number]  56 y.o. G1P0010 SingleAfrican AmericanF here for annual exam.      No LMP recorded. Patient is perimenopausal.          Sexually active: {yes no:314532}  The current method of family planning is {contraception:315051}.    Exercising: {yes no:314532}  {types:19826} Smoker:  {YES P5382123  Health Maintenance: Pap: 05/17/14, Negative with neg HR HPV  04/24/12, Negative with neg HR HPV History of abnormal Pap:  no MMG: 05/28/06, Bi-Rads 1:  Negative Colonoscopy:  08/2011, Normal, repeat in 10 years BMD:   Never TDaP:  11/25/06 Gardasil: N/A   reports that she has never smoked. She has never used smokeless tobacco. She reports that she does not drink alcohol or use drugs.  Past Medical History:  Diagnosis Date  . Breast pain, left    Since 02/2015  . CVA (cerebral vascular accident) ([place]) 05/2007  . Galactorrhea     Past Surgical History:  Procedure Laterality Date  . hysteroscopic resection polyp N/A 05/04/2002    Current Outpatient Prescriptions  Medication Sig Dispense Refill  . aspirin 81 MG tablet Take 81 mg by mouth daily.    . Cholecalciferol (VITAMIN D) 2000 UNITS tablet Take 2,000 Units by mouth daily.     No current facility-administered medications for this visit.     Family History  Problem Relation Age of Onset  . Hypertension Father   . Heart failure Father   . Diabetes Maternal Aunt   . Lung cancer Maternal Aunt     Review of Systems  Exam:   There were no vitals taken for this visit.  Weight change: @WEIGHTCHANGE @ Height:      Ht Readings from Last 3 Encounters:  05/22/15 5' 7.25" (1.708 m)  03/07/15 5\' 7"  (1.702 m)  05/17/14 5\' 7"  (1.702 m)    General appearance: alert, cooperative and appears stated age Head: Normocephalic, without obvious abnormality, atraumatic Neck: no adenopathy, supple, symmetrical, trachea midline and thyroid {CHL AMB PHY EX THYROID NORM  DEFAULT:[id number]::"normal to inspection and palpation"} Lungs: clear to auscultation bilaterally Cardiovascular: regular rate and rhythm Breasts: {Exam; breast:13139::"normal appearance, no masses or tenderness"} Abdomen: soft, non-tender; bowel sounds normal; no masses,  no organomegaly Extremities: extremities normal, atraumatic, no cyanosis or edema Skin: Skin color, texture, turgor normal. No rashes or lesions Lymph nodes: Cervical, supraclavicular, and axillary nodes normal. No abnormal inguinal nodes palpated Neurologic: Grossly normal   Pelvic: External genitalia:  no lesions              Urethra:  normal appearing urethra with no masses, tenderness or lesions              Bartholins and Skenes: normal                 Vagina: normal appearing vagina with normal color and discharge, no lesions              Cervix: {CHL AMB PHY EX CERVIX NORM DEFAULT:[phone]::"no lesions"}               Bimanual Exam:  Uterus:  {CHL AMB PHY EX UTERUS NORM DEFAULT:[phone]::"normal size, contour, position, consistency, mobility, non-tender"}              Adnexa: {CHL AMB PHY EX ADNEXA NO MASS DEFAULT:[phone]::"no mass, fullness, tenderness"}               Rectovaginal: Confirms  Anus:  normal sphincter tone, no lesions  Chaperone was present for exam.  A:  Well Woman with normal exam  P:

## 2016-09-06 ENCOUNTER — Ambulatory Visit: Payer: BC Managed Care – PPO | Admitting: Obstetrics and Gynecology

## 2016-09-13 ENCOUNTER — Ambulatory Visit: Payer: BC Managed Care – PPO | Admitting: Obstetrics and Gynecology

## 2016-09-19 ENCOUNTER — Ambulatory Visit (INDEPENDENT_AMBULATORY_CARE_PROVIDER_SITE_OTHER): Payer: BC Managed Care – PPO | Admitting: Certified Nurse Midwife

## 2016-09-19 ENCOUNTER — Other Ambulatory Visit (HOSPITAL_COMMUNITY)
Admission: RE | Admit: 2016-09-19 | Discharge: 2016-09-19 | Disposition: A | Discharge status: Home / Self Care | Payer: BC Managed Care – PPO | Source: Ambulatory Visit | Attending: Obstetrics & Gynecology | Admitting: Obstetrics & Gynecology

## 2016-09-19 ENCOUNTER — Encounter: Payer: Self-pay | Admitting: Certified Nurse Midwife

## 2016-09-19 VITALS — BP 120/72 | HR 64 | Ht 67.25 in | Wt 161.0 lb

## 2016-09-19 DIAGNOSIS — Z01419 Encounter for gynecological examination (general) (routine) without abnormal findings: Secondary | ICD-10-CM | POA: Diagnosis not present

## 2016-09-19 DIAGNOSIS — Z124 Encounter for screening for malignant neoplasm of cervix: Secondary | ICD-10-CM

## 2016-09-19 DIAGNOSIS — Z23 Encounter for immunization: Secondary | ICD-10-CM | POA: Diagnosis not present

## 2016-09-19 DIAGNOSIS — Z0000 Encounter for general adult medical examination without abnormal findings: Secondary | ICD-10-CM

## 2016-09-19 DIAGNOSIS — N951 Menopausal and female climacteric states: Secondary | ICD-10-CM | POA: Diagnosis not present

## 2016-09-19 NOTE — Patient Instructions (Signed)
EXERCISE AND DIET:  We recommended that you start or continue a regular exercise program for good health. Regular exercise means any activity that makes your heart beat faster and makes you sweat.  We recommend exercising at least 30 minutes per day at least 3 days a week, preferably 4 or 5.  We also recommend a diet low in fat and sugar.  Inactivity, poor dietary choices and obesity can cause diabetes, heart attack, stroke, and kidney damage, among others.    ALCOHOL AND SMOKING:  Women should limit their alcohol intake to no more than 7 drinks/beers/glasses of wine (combined, not each!) per week. Moderation of alcohol intake to this level decreases your risk of breast cancer and liver damage. And of course, no recreational drugs are part of a healthy lifestyle.  And absolutely no smoking or even second hand smoke. Most people know smoking can cause heart and lung diseases, but did you know it also contributes to weakening of your bones? Aging of your skin?  Yellowing of your teeth and nails?  CALCIUM AND VITAMIN D:  Adequate intake of calcium and Vitamin D are recommended.  The recommendations for exact amounts of these supplements seem to change often, but generally speaking 600 mg of calcium (either carbonate or citrate) and 800 units of Vitamin D per day seems prudent. Certain women may benefit from higher intake of Vitamin D.  If you are among these women, your doctor will have told you during your visit.    PAP SMEARS:  Pap smears, to check for cervical cancer or precancers,  have traditionally been done yearly, although recent scientific advances have shown that most women can have pap smears less often.  However, every woman still should have a physical exam from her gynecologist every year. It will include a breast check, inspection of the vulva and vagina to check for abnormal growths or skin changes, a visual exam of the cervix, and then an exam to evaluate the size and shape of the uterus and  ovaries.  And after 56 years of age, a rectal exam is indicated to check for rectal cancers. We will also provide age appropriate advice regarding health maintenance, like when you should have certain vaccines, screening for sexually transmitted diseases, bone density testing, colonoscopy, mammograms, etc.   MAMMOGRAMS:  All women over 40 years old should have a yearly mammogram. Many facilities now offer a "3D" mammogram, which may cost around $50 extra out of pocket. If possible,  we recommend you accept the option to have the 3D mammogram performed.  It both reduces the number of women who will be called back for extra views which then turn out to be normal, and it is better than the routine mammogram at detecting truly abnormal areas.    COLONOSCOPY:  Colonoscopy to screen for colon cancer is recommended for all women at age 50.  We know, you hate the idea of the prep.  We agree, BUT, having colon cancer and not knowing it is worse!!  Colon cancer so often starts as a polyp that can be seen and removed at colonscopy, which can quite literally save your life!  And if your first colonoscopy is normal and you have no family history of colon cancer, most women don't have to have it again for 10 years.  Once every ten years, you can do something that may end up saving your life, right?  We will be happy to help you get it scheduled when you are ready.    Be sure to check your insurance coverage so you understand how much it will cost.  It may be covered as a preventative service at no cost, but you should check your particular policy.     Perimenopause Perimenopause is the time when your body begins to move into the menopause (no menstrual period for 12 straight months). It is a natural process. Perimenopause can begin 2-8 years before the menopause and usually lasts for 1 year after the menopause. During this time, your ovaries may or may not produce an egg. The ovaries vary in their production of estrogen and  progesterone hormones each month. This can cause irregular menstrual periods, difficulty getting pregnant, vaginal bleeding between periods, and uncomfortable symptoms. What are the causes?  Irregular production of the ovarian hormones, estrogen and progesterone, and not ovulating every month. Other causes include:  Tumor of the pituitary gland in the brain.  Medical disease that affects the ovaries.  Radiation treatment.  Chemotherapy.  Unknown causes.  Heavy smoking and excessive alcohol intake can bring on perimenopause sooner.  What are the signs or symptoms?  Hot flashes.  Night sweats.  Irregular menstrual periods.  Decreased sex drive.  Vaginal dryness.  Headaches.  Mood swings.  Depression.  Memory problems.  Irritability.  Tiredness.  Weight gain.  Trouble getting pregnant.  The beginning of losing bone cells (osteoporosis).  The beginning of hardening of the arteries (atherosclerosis). How is this diagnosed? Your health care provider will make a diagnosis by analyzing your age, menstrual history, and symptoms. He or she will do a physical exam and note any changes in your body, especially your female organs. Female hormone tests may or may not be helpful depending on the amount of female hormones you produce and when you produce them. However, other hormone tests may be helpful to rule out other problems. How is this treated? In some cases, no treatment is needed. The decision on whether treatment is necessary during the perimenopause should be made by you and your health care provider based on how the symptoms are affecting you and your lifestyle. Various treatments are available, such as:  Treating individual symptoms with a specific medicine for that symptom.  Herbal medicines that can help specific symptoms.  Counseling.  Group therapy.  Follow these instructions at home:  Keep track of your menstrual periods (when they occur, how heavy  they are, how long between periods, and how long they last) as well as your symptoms and when they started.  Only take over-the-counter or prescription medicines as directed by your health care provider.  Sleep and rest.  Exercise.  Eat a diet that contains calcium (good for your bones) and soy (acts like the estrogen hormone).  Do not smoke.  Avoid alcoholic beverages.  Take vitamin supplements as recommended by your health care provider. Taking vitamin E may help in certain cases.  Take calcium and vitamin D supplements to help prevent bone loss.  Group therapy is sometimes helpful.  Acupuncture may help in some cases. Contact a health care provider if:  You have questions about any symptoms you are having.  You need a referral to a specialist (gynecologist, psychiatrist, or psychologist). Get help right away if:  You have vaginal bleeding.  Your period lasts longer than 8 days.  Your periods are recurring sooner than 21 days.  You have bleeding after intercourse.  You have severe depression.  You have pain when you urinate.  You have severe headaches.  You have vision   problems. This information is not intended to replace advice given to you by your health care provider. Make sure you discuss any questions you have with your health care provider. Document Released: 03/21/2004 Document Revised: 07/20/2015 Document Reviewed: 09/10/2012 Elsevier Interactive Patient Education  2017 [employer]  

## 2016-09-19 NOTE — Progress Notes (Signed)
56 y.o. G1P0010 Single  African American Fe here for annual exam. LMP was 4/18, normal amount, previously every 4 months. Having hot flashes/night sweats. Some issues with sleep due to night sweats. Sees Dr. Brien Mates prn. Had flu last year, and saw PCP for treatment.  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 Had previous injury and has had edema in past. Will follow up with PCP if problem continues. Screening labs desired. Took beach trip this summer!  No LMP recorded. Patient is perimenopausal.          Sexually active: Yes.    The current method of family planning is none.    Exercising: No.  The patient does not participate in regular exercise at present. Smoker:  no  Health Maintenance: Pap: 05/17/14, Negative with neg HR HPV  04/24/12, Negative with neg HR HPV History of Abnormal Pap: no MMG:  05/27/16, 3D, Density Category B, Bi-Rads 1:  Negative Self Breast exams: yes Colonoscopy:  08/2011, repeat in 5 years, pt received scheduling letter yesterday BMD:   Due now Has done in [place]. [place] TDaP:  11/25/06 Shingles: never Pneumonia: never Hep C and HIV: yes Labs: ? Fasting labs   reports that she has never smoked. She has never used smokeless tobacco. She reports that she does not drink alcohol or use drugs.  Past Medical History:  Diagnosis Date  . Breast pain, left    Since 02/2015  . CVA (cerebral vascular accident) ([place]) 05/2007  . Galactorrhea     Past Surgical History:  Procedure Laterality Date  . hysteroscopic resection polyp N/A 05/04/2002    Current Outpatient Prescriptions  Medication Sig Dispense Refill  . aspirin 81 MG tablet Take 81 mg by mouth daily.    . Cholecalciferol (VITAMIN D) 2000 UNITS tablet Take 2,000 Units by mouth daily.     No current facility-administered medications for this visit.     Family History  Problem Relation Age of Onset  . Hypertension Father   . Heart failure Father   . Diabetes Maternal Aunt   . Lung cancer  Maternal Aunt     ROS:  Pertinent items are noted in HPI.  Otherwise, a comprehensive ROS was negative.  Exam:   There were no vitals taken for this visit.   Ht Readings from Last 3 Encounters:  05/22/15 5' 7.25" (1.708 m)  03/07/15 5\' 7"  (1.702 m)  05/17/14 5\' 7"  (1.702 m)    General appearance: alert, cooperative and appears stated age Head: Normocephalic, without obvious abnormality, atraumatic Neck: no adenopathy, supple, symmetrical, trachea midline and thyroid normal to inspection and palpation Lungs: clear to auscultation bilaterally Breasts: normal appearance, no masses or tenderness, No nipple retraction or dimpling, No nipple discharge or bleeding, No axillary or supraclavicular adenopathy Heart: regular rate and rhythm Abdomen: soft, non-tender; no masses,  no organomegaly Extremities: extremities normal, atraumatic, no cyanosis or edema Skin: Skin color, texture, turgor normal. No rashes or lesions Lymph nodes: Cervical, supraclavicular, and axillary nodes normal. No abnormal inguinal nodes palpated Neurologic: Grossly normal   Pelvic: External genitalia:  no lesions              Urethra:  normal appearing urethra with no masses, tenderness or lesions              Bartholin's and Skene's: normal                 Vagina: normal appearing vagina with normal color and discharge,  no lesions              Cervix: no bleeding following Pap, no cervical motion tenderness and no lesions              Pap taken: Yes.   Bimanual Exam:  Uterus:  enlarged, 12-14  weeks size known multiple fibroids per [place], no change in size from last [place]              Adnexa: normal adnexa and no mass, fullness, tenderness               Rectovaginal: Confirms               Anus:  normal sphincter tone, no lesions  Chaperone present: yes  A:  Well Woman with normal exam  Perimenopausal with cycle changes, periods every 4 months in past year. Now having symptoms of menopause with hot flashes and  night sweats  Enlarged Uterus history of fibroids no change in size since last PUS  History of Stroke  Immunization update  Screening labs today    P:   Reviewed health and wellness pertinent to exam  Discussed importance of keeping up with menses if none in next 1-2 months advise.  Discussed no change in uterine size per chart. Warning signs of fibroids given.  Aware of signs of stroke and to seek 911 or ER  Requests TDAP  Labs: CBC, FSH,Lipid panel, Prolactin, TSH, Vitamin D  Pap smear: yes  counseled on breast self exam, mammography screening, menopause, adequate intake of calcium and vitamin D, diet and exercise  return annually or prn  An After Visit Summary was printed and given to the patient.

## 2016-09-20 ENCOUNTER — Other Ambulatory Visit: Payer: Self-pay | Admitting: Certified Nurse Midwife

## 2016-09-20 DIAGNOSIS — Z862 Personal history of diseases of the blood and blood-forming organs and certain disorders involving the immune mechanism: Secondary | ICD-10-CM

## 2016-09-20 LAB — CBC
Hematocrit: 36.9 % (ref 34.0–46.6)
Hemoglobin: 11.8 g/dL (ref 11.1–15.9)
MCH: 25.8 pg — ABNORMAL LOW (ref 26.6–33.0)
MCHC: 32 g/dL (ref 31.5–35.7)
MCV: 81 fL (ref 79–97)
PLATELETS: 196 10*3/uL (ref 150–379)
RBC: 4.58 x10E6/uL (ref 3.77–5.28)
RDW: 14.5 % (ref 12.3–15.4)
WBC: 3.1 10*3/uL — AB (ref 3.4–10.8)

## 2016-09-20 LAB — LIPID PANEL
CHOL/HDL RATIO: 2.9 ratio (ref 0.0–4.4)
Cholesterol, Total: 194 mg/dL (ref 100–199)
HDL: 67 mg/dL (ref >39)
LDL CALC: 118 mg/dL — AB (ref 0–99)
Triglycerides: 47 mg/dL (ref 0–149)
VLDL CHOLESTEROL CAL: 9 mg/dL (ref 5–40)

## 2016-09-20 LAB — TSH: TSH: 0.537 u[IU]/mL (ref 0.450–4.500)

## 2016-09-20 LAB — FOLLICLE STIMULATING HORMONE: FSH: 102.1 m[IU]/mL

## 2016-09-20 LAB — VITAMIN D 25 HYDROXY (VIT D DEFICIENCY, FRACTURES): Vit D, 25-Hydroxy: 38.8 ng/mL (ref 30.0–100.0)

## 2016-09-20 LAB — PROLACTIN: Prolactin: 9.6 ng/mL (ref 4.8–23.3)

## 2016-09-23 LAB — CYTOLOGY - PAP: Diagnosis: NEGATIVE

## 2016-09-26 ENCOUNTER — Telehealth: Payer: Self-pay | Admitting: Obstetrics & Gynecology

## 2016-09-26 NOTE — Telephone Encounter (Signed)
Patient is asking if she was supposed to call if did or did not start her cycle? Patient also has questions regarding her iron results and iron medication.

## 2016-09-26 NOTE — Telephone Encounter (Signed)
Called patient and she wanted to know if Debbie asked her to call if she had a period or didn't have a period. Advised patient to call in 1-2 months if she did not have a cycle. I also reviewed her lab results again from 09-19-16 and made 2 month f/u lab appointment for 11-13-16 at 3:00pm.

## 2016-10-08 NOTE — Telephone Encounter (Signed)
Okay to close encounter?  °

## 2016-10-16 NOTE — Telephone Encounter (Signed)
Yes ok to close

## 2016-11-13 ENCOUNTER — Other Ambulatory Visit: Payer: BC Managed Care – PPO

## 2016-12-11 ENCOUNTER — Other Ambulatory Visit (INDEPENDENT_AMBULATORY_CARE_PROVIDER_SITE_OTHER): Payer: BC Managed Care – PPO

## 2016-12-11 ENCOUNTER — Telehealth: Payer: Self-pay | Admitting: *Deleted

## 2016-12-11 DIAGNOSIS — Z862 Personal history of diseases of the blood and blood-forming organs and certain disorders involving the immune mechanism: Secondary | ICD-10-CM

## 2016-12-11 NOTE — Telephone Encounter (Signed)
Patient in office for lab appointment for CBC. Patient states she thought she was going to be seen today. Patient request to express concerns regarding missed cycles. Patient states she was previously advised if no bleeding "something else could be wrong".   Advised patient per review of last [facility] 09/19/16 -menopausal. Confirmed with patient LMP 05/2016. Advised patient to continue to monitor and return call to office with any future bleeding.   Patient states she has been experiencing hot flashes and having trouble sleeping, would like to discuss OTC or non-estrogen options for treatment, request OV with Dr. Sabra Heck. 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 Sabra Heck review and return call to schedule.   Advised patient would review concerns with Dr. Sabra Heck when she returns to the office on 10/18 and return call with recommendations and scheduling. Patient verbalizes understanding and is agreeable.   Dr. Sabra Heck -please review and advise?  Cc: Melvia Heaps, CNM

## 2016-12-12 LAB — CBC
Hematocrit: 37.6 % (ref 34.0–46.6)
Hemoglobin: 11.8 g/dL (ref 11.1–15.9)
MCH: 25.8 pg — ABNORMAL LOW (ref 26.6–33.0)
MCHC: 31.4 g/dL — ABNORMAL LOW (ref 31.5–35.7)
MCV: 82 fL (ref 79–97)
PLATELETS: 211 10*3/uL (ref 150–379)
RBC: 4.57 x10E6/uL (ref 3.77–5.28)
RDW: 14.1 % (ref 12.3–15.4)
WBC: 3.9 10*3/uL (ref 3.4–10.8)

## 2016-12-12 NOTE — Telephone Encounter (Signed)
Spoke with patient, advised as seen below per Dr. Sabra Heck. Patient request to schedule on 12/28, scheduled for OV on 02/21/17 at 8:30am, patient aware this OV may be rescheduled. Patient verbalizes understanding and is agreeable, thankful for return call.  Patient is agreeable to disposition. Will close encounter.

## 2016-12-12 NOTE — Telephone Encounter (Signed)
Ok to try OTC estroven to see if this helps or black cohosh twice daily as well.  If no significant improvement, there are some non-hormonal treatments (prescriptions) that we could discuss.  Ok to scheduled appt for follow-up and let her go ahead and start OTC treatment now.

## 2017-02-21 ENCOUNTER — Other Ambulatory Visit: Payer: Self-pay

## 2017-02-21 ENCOUNTER — Encounter: Payer: Self-pay | Admitting: Obstetrics & Gynecology

## 2017-02-21 ENCOUNTER — Ambulatory Visit: Payer: BC Managed Care – PPO | Admitting: Obstetrics & Gynecology

## 2017-02-21 VITALS — BP 108/60 | HR 68 | Resp 18 | Ht 67.25 in | Wt 171.0 lb

## 2017-02-21 DIAGNOSIS — N912 Amenorrhea, unspecified: Secondary | ICD-10-CM

## 2017-02-21 DIAGNOSIS — N951 Menopausal and female climacteric states: Secondary | ICD-10-CM | POA: Diagnosis not present

## 2017-02-21 MED ORDER — GABAPENTIN 100 MG PO CAPS: ORAL_CAPSULE | ORAL | 0 refills | Status: DC

## 2017-02-21 NOTE — Progress Notes (Signed)
GYNECOLOGY  VISIT  CC:   Menopausal symptoms  HPI: 56 y.o. G1P0010 Single African American female here for menopause symptoms.  Has not cycled now in about 8 months.  [place] in July was 102.  Having hot flashes and nigth sweats.  Having associated insomnia due to waking up at night.  Having mild vaginal dryness.    Has tried black cohosh.  Used this daily.  Does not think this helped at all.    GYNECOLOGIC HISTORY: Patient's last menstrual period was 06/01/2016 (approximate). Contraception: perimenopausal  Menopausal hormone therapy: none  Patient Active Problem List   Diagnosis Date Noted  . History of stroke 02/16/2014  . Fibroid uterus 05/10/2013  . Unspecified vitamin D deficiency 05/10/2013    Past Medical History:  Diagnosis Date  . Arthritis 2018  . Breast pain, left    Since 02/2015  . CVA (cerebral vascular accident) ([place]) 05/2007  . Galactorrhea     Past Surgical History:  Procedure Laterality Date  . hysteroscopic resection polyp N/A 05/04/2002    MEDS:   Current Outpatient Medications on File Prior to Visit  Medication Sig Dispense Refill  . acetaminophen (ARTHRITIS PAIN RELIEF) 650 MG CR tablet Take 650 mg by mouth every 8 (eight) hours as needed for pain.    Marland Kitchen aspirin 81 MG tablet Take 81 mg by mouth daily.    . Black Cohosh 200 MG CAPS Take by mouth daily.    . Cholecalciferol (VITAMIN D) 2000 UNITS tablet Take 2,000 Units by mouth daily.    Marland Kitchen glucosamine-chondroitin 500-400 MG tablet Take 1 tablet by mouth daily.    . Turmeric Curcumin 500 MG CAPS Take 1 capsule by mouth daily.     No current facility-administered medications on file prior to visit.     ALLERGIES: Bee venom and Latex  Family History  Problem Relation Age of Onset  . Hypertension Father   . Heart failure Father   . Diabetes Maternal Aunt   . Lung cancer Maternal Aunt   . Lung cancer Mother 20  . Bone cancer Sister 76  . Diabetes Brother 64    SH:  Single, non-smoker  Review of  Systems  Constitutional:       Weight gain  All other systems reviewed and are negative.   PHYSICAL EXAMINATION:    BP 108/60 (BP Location: Right Arm, Patient Position: Sitting, Cuff Size: Normal)   Pulse 68   Resp 18   Ht 5' 7.25" (1.708 m)   Wt 171 lb (77.6 kg)   LMP 06/01/2016 (Approximate)   BMI 26.58 kg/m     General appearance: alert, cooperative and appears stated age CV:  Regular rate and rhythm Lungs:  clear to auscultation, no wheezes, rales or rhonchi, symmetric air entry  Chaperone was present for exam.  Assessment: Amenorrhea  Plan: Repeat FSH today.  Feel this will be elevated.  She is going to try Neurontin 100mg  nightly for 7 days.  Will increase to 200mg  nightly for a week and then increase to 300mg  if needed. Pt has not done colonoscopy due to issues with transportation after colonoscopy.  Will order Cologuard.   She will call and give update between 2-4 weeks.     ~20 minutes spent with patient >50% of time was in face to face discussion of above.

## 2017-02-22 LAB — FOLLICLE STIMULATING HORMONE: FSH: 41.5 m[IU]/mL

## 2017-02-24 ENCOUNTER — Telehealth: Payer: Self-pay | Admitting: *Deleted

## 2017-02-24 NOTE — Telephone Encounter (Signed)
-----   Message from Megan Salon, MD sent at 02/23/2017 11:08 PM EST ----- Please let pt know her [facility] is still in menopausal range.  She was going to start Neurontin for hot flashes.  She needs to give me an update after she increases the dosage and/or if has any side effects.  Thanks.

## 2017-02-24 NOTE — Telephone Encounter (Signed)
LM for pt to call back.

## 2017-02-24 NOTE — Telephone Encounter (Signed)
Pt notified.  Verbalized understanding.

## 2017-03-24 LAB — COLOGUARD: Cologuard: POSITIVE

## 2017-03-25 ENCOUNTER — Other Ambulatory Visit: Payer: Self-pay | Admitting: *Deleted

## 2017-03-25 ENCOUNTER — Telehealth: Payer: Self-pay | Admitting: *Deleted

## 2017-03-25 DIAGNOSIS — Z1211 Encounter for screening for malignant neoplasm of colon: Secondary | ICD-10-CM

## 2017-03-25 NOTE — Telephone Encounter (Signed)
Message left to return call to Jensine Luz at [phone].    

## 2017-03-26 NOTE — Telephone Encounter (Signed)
Patient returned Alison Castillo's call and requests a call back tomorrow, 03/27/17.

## 2017-03-27 NOTE — Telephone Encounter (Signed)
Returned call to patient. Appointment date and time provided for patient. Patient frustrated about having to commute twice to [facility] for consultation and then the actual colonoscopy. RN advised could put in referral with [facility] to have colonoscopy in [place]. Patient states she will think about it and update in am at appointment. Patient will keep appointment with Dr. Katherene Ponto as scheduled for now.   Routing to provider for final review. Patient agreeable to disposition. Will close encounter.

## 2017-03-27 NOTE — Telephone Encounter (Signed)
Message left to return call to Emily at [phone].    

## 2017-03-27 NOTE — Telephone Encounter (Signed)
Cologuard result faxed to Dr. Carlena Sax office. Fax #: [phone].

## 2017-03-27 NOTE — Telephone Encounter (Signed)
Patient returned call. Cologuard results reviewed with patient and she verbalized understanding. RN advised patient needs colonoscopy. Patient requests to scheduled with Dr. Katherene Ponto again in [facility] so that she will have family around to take her home after procedure. RN advised could assist patient in scheduling and would return call with appointment. Patient agreeable.   Patient also states that Dr. Sabra Heck told her to call if she had any bleeding. Patient states she spotted for "2 days last week, like I was shedding the lining." States the first day it was "dark red" and the second day it was "pale." RN advised OV recommended for further evaluation. OV scheduled for 03/28/17 at 0830. Patient agreeable to date and time of appointment.

## 2017-03-27 NOTE — Telephone Encounter (Signed)
Call to Dr. Carlena Sax office to schedule patient. Consultation scheduled for Friday 04/04/17 at 1500. Advised Inez Catalina would fax a copy of cologuard results to office.

## 2017-03-28 ENCOUNTER — Ambulatory Visit: Payer: BC Managed Care – PPO | Admitting: Obstetrics & Gynecology

## 2017-03-28 ENCOUNTER — Other Ambulatory Visit: Payer: Self-pay

## 2017-03-28 ENCOUNTER — Encounter: Payer: Self-pay | Admitting: Obstetrics & Gynecology

## 2017-03-28 VITALS — BP 112/64 | HR 66 | Resp 14 | Wt 170.0 lb

## 2017-03-28 DIAGNOSIS — D251 Intramural leiomyoma of uterus: Secondary | ICD-10-CM | POA: Diagnosis not present

## 2017-03-28 DIAGNOSIS — N882 Stricture and stenosis of cervix uteri: Secondary | ICD-10-CM | POA: Diagnosis not present

## 2017-03-28 DIAGNOSIS — N950 Postmenopausal bleeding: Secondary | ICD-10-CM

## 2017-03-28 MED ORDER — NYSTATIN 100000 UNIT/GM EX CREA: 1.0000 "application " | TOPICAL_CREAM | Freq: Two times a day (BID) | CUTANEOUS | 0 refills | Status: DC

## 2017-03-28 NOTE — Addendum Note (Signed)
Addended by: Megan Salon on: 03/28/2017 09:53 AM   Modules accepted: Orders

## 2017-03-28 NOTE — Progress Notes (Addendum)
GYNECOLOGY  VISIT  CC:   Postmenopausal bleeding  HPI: 57 y.o. G1P0010 Single African American female here for spotting for a couple of days this month.  This was January 23rd and 24th.  She did have some associated cramping.  There was some tissue that was present as well.  Denies breast tenderness or acne.  Did have an [place] 02/21/17.    Decided not to try the gabapentin.  Continuing to use black cohosh and is comfortable with this right now.    Has area on chest she would like me to look at today.  It itchy.   GYNECOLOGIC HISTORY: No LMP recorded. Patient is perimenopausal. Contraception: none Menopausal hormone therapy: none  Patient Active Problem List   Diagnosis Date Noted  . History of stroke 02/16/2014  . Fibroid uterus 05/10/2013  . Unspecified vitamin D deficiency 05/10/2013    Past Medical History:  Diagnosis Date  . Arthritis 2018  . Breast pain, left    Since 02/2015  . CVA (cerebral vascular accident) ([place]) 05/2007  . Galactorrhea     Past Surgical History:  Procedure Laterality Date  . hysteroscopic resection polyp N/A 05/04/2002    MEDS:   Current Outpatient Medications on File Prior to Visit  Medication Sig Dispense Refill  . acetaminophen (ARTHRITIS PAIN RELIEF) 650 MG CR tablet Take 650 mg by mouth every 8 (eight) hours as needed for pain.    Marland Kitchen aspirin 81 MG tablet Take 81 mg by mouth daily.    . Black Cohosh 200 MG CAPS Take by mouth daily.    . Cholecalciferol (VITAMIN D) 2000 UNITS tablet Take 2,000 Units by mouth daily.    Marland Kitchen glucosamine-chondroitin 500-400 MG tablet Take 1 tablet by mouth daily.    . Turmeric Curcumin 500 MG CAPS Take 1 capsule by mouth daily.    Marland Kitchen gabapentin (NEURONTIN) 100 MG capsule Take 1 capsule nightly x 7 nights.  Then increase to 2 capsules nightly x 7 days.  Then increase to 3 capsules nightly. (Patient not taking: Reported on 03/28/2017) 63 capsule 0   No current facility-administered medications on file prior to visit.      ALLERGIES: Bee venom and Latex  Family History  Problem Relation Age of Onset  . Hypertension Father   . Heart failure Father   . Diabetes Maternal Aunt   . Lung cancer Maternal Aunt   . Lung cancer Mother 80  . Bone cancer Sister 21  . Diabetes Brother 40    SH:  Single, non smoker  Review of Systems  Genitourinary:       Vaginal bleeding, minimal cramping  All other systems reviewed and are negative.   PHYSICAL EXAMINATION:    BP 112/64 (BP Location: Right Arm, Patient Position: Sitting, Cuff Size: Normal)   Pulse 66   Resp 14   Wt 170 lb (77.1 kg)   BMI 26.43 kg/m     General appearance: alert, cooperative and appears stated age Skin: scaly lesion mid chest right  Abdomen: soft, non-tender; bowel sounds normal; no masses,  no organomegaly Lymph:  No inguinal LAD  Pelvic: External genitalia:  no lesions              Urethra:  normal appearing urethra with no masses, tenderness or lesions              Bartholins and Skenes: normal                 Vagina: normal appearing  vagina with normal color and discharge, no lesions              Cervix: no lesions and stenotic os              Bimanual Exam:  Uterus:  Enlarged about 14 weeks with 6-8 cm anterior fibroid, mobile              Adnexa: no mass, fullness, tenderness   Endometrial biopsy recommended.  Discussed with patient.  Verbal and written consent obtained.   Procedure:  Speculum placed.  Cervix visualized and cleansed with betadine prep.  A single toothed tenaculum was applied to the anterior lip of the cervix.  Dilation with milex dilator required due to stenotic os.  Endometrial pipelle was advanced through the cervix into the endometrial cavity without difficulty.  Pipelle passed to6cm.  Suction applied and pipelle removed with dark, old, mucous bloody sample obtained.  Tenculum removed.  No bleeding noted.  Patient tolerated procedure well.  Chaperone was present for exam.  Assessment: PMP  bleeding Stenotic os Enlarged fibroid uterus Chest wall lesion  Plan: Biopsy will be sent to pathology.  Results will be called to pt. Nystatin cream to pharmacy bid for up to 7 days.

## 2017-04-08 ENCOUNTER — Telehealth: Payer: Self-pay | Admitting: *Deleted

## 2017-04-08 DIAGNOSIS — D251 Intramural leiomyoma of uterus: Secondary | ICD-10-CM

## 2017-04-08 DIAGNOSIS — N950 Postmenopausal bleeding: Secondary | ICD-10-CM

## 2017-04-08 NOTE — Telephone Encounter (Signed)
Notes recorded by Burnice Logan, RN on 04/08/2017 at 10:25 AM EST Left message to call Sharee Pimple at [phone].

## 2017-04-08 NOTE — Telephone Encounter (Signed)
-----   Message from Megan Salon, MD sent at 04/04/2017  9:13 AM EST ----- Please let pt know her endometrial biopsy was negative but there was not a lot of tissue in the sample.  I think it would be wise to proceed with ultrasound to assess her endometrial thickness.  Last ultrasound was in 2015.  Please see if she will schedule.  Thanks.

## 2017-04-11 NOTE — Telephone Encounter (Signed)
Spoke with patient, advised of results and recommendations as seen below. Patient is agreeable to PUS, declines to schedule earlier than last week of March d/t work schedule.   PUS scheduled for 05/22/17 at 12:30pm with consult at 1pm with Dr. Sabra Heck. Advised will review with Dr. Sabra Heck and return call with any additional recommendations, patient is agreeable.   Order placed for PUS.   Routing to provider for final review. Patient is agreeable to disposition. Will close encounter.  Cc: Lerry Liner

## 2017-04-15 ENCOUNTER — Encounter: Payer: Self-pay | Admitting: Obstetrics & Gynecology

## 2017-05-22 ENCOUNTER — Encounter: Payer: Self-pay | Admitting: Obstetrics & Gynecology

## 2017-05-22 ENCOUNTER — Ambulatory Visit (INDEPENDENT_AMBULATORY_CARE_PROVIDER_SITE_OTHER): Payer: BC Managed Care – PPO

## 2017-05-22 ENCOUNTER — Ambulatory Visit (INDEPENDENT_AMBULATORY_CARE_PROVIDER_SITE_OTHER): Payer: BC Managed Care – PPO | Admitting: Obstetrics & Gynecology

## 2017-05-22 ENCOUNTER — Other Ambulatory Visit: Payer: Self-pay | Admitting: Obstetrics & Gynecology

## 2017-05-22 VITALS — BP 120/76 | HR 60 | Resp 14 | Ht 67.25 in | Wt 168.0 lb

## 2017-05-22 DIAGNOSIS — D251 Intramural leiomyoma of uterus: Secondary | ICD-10-CM | POA: Diagnosis not present

## 2017-05-22 DIAGNOSIS — Z139 Encounter for screening, unspecified: Secondary | ICD-10-CM

## 2017-05-22 DIAGNOSIS — N950 Postmenopausal bleeding: Secondary | ICD-10-CM

## 2017-05-22 NOTE — Progress Notes (Signed)
57 y.o. G1P0010 Single African American female here for pelvic ultrasound due to postmenopausal bleeding that has not occurred again.  Pt has an endometrial biopsy on 03/28/17 that showed benign tissue but specimen was scant.  Recommended ultrasound for additional evaluation.  Patient's last menstrual period was 03/19/2017.  Contraception: PMP  Findings:  UTERUS: 8.9 x 11.0 x 7.3cm.  Multiple fibroids noted.  at least nine measured today with two larger fibroids 6.7cm and 5.0cm.   EMS: 5.26mm, distorted due to fibroids ADNEXA: Left ovary: 2.0 x 1.2 x 0.9cm       Right ovary: 2.2 x 1.9 x 9.7QB with 50mm follicle CUL DE SAC: no free fluids  Discussion:  Findings and pictures reviewed with pt.  Endometrium does not appear to have any lesions but it is distorted due to fibroids.  D/w pt that neither ultrasound today or biopsy showed anything worrisome, however, there is not enough information to definitively say there is no abnormality present.  Therefore, if she has another episode of bleeding, then I would recommend proceeding with hysteroscopy and D&C and removal of any endometrial lesion if present at the time of evaluation.  Pt comfortable with plan.  Questions answered.  Assessment:  PMP bleeding Known uterine fibroids  Plan:  Will proceed with hysteroscopy and D&C if pt has any additional bleeding.  Procedure briefly discussed.    ~15 minutes spent with patient >50% of time was in face to face discussion of above.

## 2017-06-30 ENCOUNTER — Ambulatory Visit
Admission: RE | Admit: 2017-06-30 | Discharge: 2017-06-30 | Disposition: A | Discharge status: Home / Self Care | Payer: BC Managed Care – PPO | Source: Ambulatory Visit | Attending: Obstetrics & Gynecology | Admitting: Obstetrics & Gynecology

## 2017-06-30 DIAGNOSIS — Z139 Encounter for screening, unspecified: Secondary | ICD-10-CM

## 2017-07-08 ENCOUNTER — Telehealth: Payer: Self-pay | Admitting: Obstetrics & Gynecology

## 2017-07-08 NOTE — Telephone Encounter (Signed)
Patient is being billed in full for her cologuard. Patient is asking if this could "be sent through her insurance again."?

## 2017-07-08 NOTE — Telephone Encounter (Signed)
Spoke with patient. 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 Patient verbalizes understanding. States that she has their contact information and will contact them directly to discuss.  Routing to provider for final review. Patient agreeable to disposition. Will close encounter.

## 2017-09-08 ENCOUNTER — Telehealth: Payer: Self-pay | Admitting: Obstetrics & Gynecology

## 2017-09-08 NOTE — Telephone Encounter (Signed)
Return call to patient. Left breast lump noted 09-04-17. Has tried warm compress. Discomfort improved but lump still present.  Requested mammogram. Advised recommend office visit for evaluation. Appointment scheduled for 09-12-17 per patient request. Advised to call back if decides would like earlier appointment.  Routing to provider for review. Will close encounter.

## 2017-09-08 NOTE — Telephone Encounter (Signed)
Patient found lump in left breast. Patient called [facility] and they told her if she got "a referral or release or something" that she could come on in and get a mammogram.

## 2017-09-12 ENCOUNTER — Ambulatory Visit: Payer: BC Managed Care – PPO | Admitting: Obstetrics & Gynecology

## 2017-09-12 ENCOUNTER — Encounter: Payer: Self-pay | Admitting: Obstetrics & Gynecology

## 2017-09-12 VITALS — BP 126/62 | HR 72 | Ht 67.5 in | Wt 170.0 lb

## 2017-09-12 DIAGNOSIS — N6320 Unspecified lump in the left breast, unspecified quadrant: Secondary | ICD-10-CM | POA: Diagnosis not present

## 2017-09-12 NOTE — Patient Instructions (Addendum)
Dr. Hector Shade Address: [address] [place], [place] [address]  Phone: [phone]  Dr. Rosanne Ashing Address  [address], [place] [address]  Appointments [phone]

## 2017-09-12 NOTE — Progress Notes (Signed)
GYNECOLOGY  VISIT  CC:   Left breast lump  HPI: 57 y.o. G1P0010 Single African American female here for left breast lump on and off that is accompanied by soreness.  This is always on the left breast.  Pt reports, years ago, she had something similar when she was drinking a lot of caffeine.  Recently, the soreness and lump was most noticeable on July 9th.  Then she started using a warm compress and this really helped.  MMG was normal 06/30/17.  She did a 3D at that time as well.  Denies breast trauma.  She does not have any nipple discharge.    As a separate issue, she is having issues with her left knee.  May need knee replacement.  Seeing ortho but would like other suggestions as well.  Names provided.    GYNECOLOGIC HISTORY: Patient's last menstrual period was 03/19/2017. Contraception: post menopausal  Menopausal hormone therapy: none  Patient Active Problem List   Diagnosis Date Noted  . History of stroke 02/16/2014  . Fibroid uterus 05/10/2013  . Unspecified vitamin D deficiency 05/10/2013    Past Medical History:  Diagnosis Date  . Arthritis 2018  . Breast pain, left    Since 02/2015  . CVA (cerebral vascular accident) ([place]) 05/2007  . Galactorrhea     Past Surgical History:  Procedure Laterality Date  . hysteroscopic resection polyp N/A 05/04/2002    MEDS:   Current Outpatient Medications on File Prior to Visit  Medication Sig Dispense Refill  . acetaminophen (ARTHRITIS PAIN RELIEF) 650 MG CR tablet Take 650 mg by mouth every 8 (eight) hours as needed for pain.    Marland Kitchen aspirin 81 MG tablet Take 81 mg by mouth daily.    . Cholecalciferol (VITAMIN D) 2000 UNITS tablet Take 2,000 Units by mouth daily.    Marland Kitchen glucosamine-chondroitin 500-400 MG tablet Take 1 tablet by mouth daily.    . Turmeric Curcumin 500 MG CAPS Take 1 capsule by mouth daily.    . meloxicam (MOBIC) 15 MG tablet TAKE 1 TABLET BY MOUTH ONCE DAILY FOR 30 DAYS  0   No current facility-administered medications  on file prior to visit.     ALLERGIES: Bee venom and Latex  Family History  Problem Relation Age of Onset  . Hypertension Father   . Heart failure Father   . Diabetes Maternal Aunt   . Lung cancer Maternal Aunt   . Lung cancer Mother 57  . Bone cancer Sister 61  . Diabetes Brother 72    SH:  Single, non smoker  Review of Systems  Genitourinary:       Loss of urine with sneeze or cough   Musculoskeletal: Positive for myalgias.       Swelling   Endo/Heme/Allergies:       Craving sweets   All other systems reviewed and are negative.   PHYSICAL EXAMINATION:    BP 126/62   Pulse 72   Ht 5' 7.5" (1.715 m)   Wt 170 lb (77.1 kg)   LMP 03/19/2017   BMI 26.23 kg/m     Physical Exam  Constitutional: She is oriented to person, place, and time. She appears well-developed and well-nourished.  HENT:  Head: Normocephalic and atraumatic.  Neck: Normal range of motion. No thyromegaly present.  Respiratory: Right breast exhibits no inverted nipple, no mass, no nipple discharge, no skin change and no tenderness. Left breast exhibits no inverted nipple, no mass, no nipple discharge, no skin change and  no tenderness. Breasts are symmetrical.  Lymphadenopathy:    She has no cervical adenopathy.    She has no axillary adenopathy.  Neurological: She is alert and oriented to person, place, and time.  Skin: Skin is warm and dry.  Psychiatric: She has a normal mood and affect.   Assessment: Left breast lump that has now resolved  Plan: Given exam today, decrease in size of lesion felt by pt and normal MMG 5/19, feel it is ok to continue monitoring.  However, pt knows if new lesion occurs and she desires diagnostic imaging, she just needs to call and we will help her get this scheduled.  Pt comfortable with plan.  Voices clear understanding.   ~15 minutes spent with patient >50% of time was in face to face discussion of above.

## 2017-09-22 ENCOUNTER — Ambulatory Visit: Payer: BC Managed Care – PPO | Admitting: Obstetrics & Gynecology

## 2018-02-02 ENCOUNTER — Other Ambulatory Visit: Payer: Self-pay

## 2018-02-02 ENCOUNTER — Ambulatory Visit: Payer: BC Managed Care – PPO | Admitting: Obstetrics & Gynecology

## 2018-02-02 ENCOUNTER — Other Ambulatory Visit (HOSPITAL_COMMUNITY)
Admission: RE | Admit: 2018-02-02 | Discharge: 2018-02-02 | Disposition: A | Discharge status: Home / Self Care | Payer: BC Managed Care – PPO | Source: Ambulatory Visit | Attending: Obstetrics & Gynecology | Admitting: Obstetrics & Gynecology

## 2018-02-02 ENCOUNTER — Encounter: Payer: Self-pay | Admitting: Obstetrics & Gynecology

## 2018-02-02 ENCOUNTER — Encounter

## 2018-02-02 VITALS — BP 110/60 | HR 72 | Resp 16 | Ht 66.75 in | Wt 161.8 lb

## 2018-02-02 DIAGNOSIS — Z124 Encounter for screening for malignant neoplasm of cervix: Secondary | ICD-10-CM

## 2018-02-02 DIAGNOSIS — R5383 Other fatigue: Secondary | ICD-10-CM

## 2018-02-02 DIAGNOSIS — Z01419 Encounter for gynecological examination (general) (routine) without abnormal findings: Secondary | ICD-10-CM | POA: Diagnosis not present

## 2018-02-02 DIAGNOSIS — Z205 Contact with and (suspected) exposure to viral hepatitis: Secondary | ICD-10-CM | POA: Diagnosis not present

## 2018-02-02 MED ORDER — GABAPENTIN 100 MG PO CAPS
ORAL_CAPSULE | ORAL | 0 refills | Status: DC
Start: 2018-02-02 — End: 2019-03-15

## 2018-02-02 NOTE — Patient Instructions (Signed)
Recombinant Zoster (Shingles) Vaccine, RZV: What You Need to Know    1. Why get vaccinated?  Shingles (also called herpes zoster, or just zoster) is a painful skin rash, often with blisters. Shingles is caused by the varicella zoster virus, the same virus that causes chickenpox. After you have chickenpox, the virus stays in your body and can cause shingles later in life.  You can't catch shingles from another person. However, a person who has never had chickenpox (or chickenpox vaccine) could get chickenpox from someone with shingles.  A shingles rash usually appears on one side of the face or body and heals within 2 to 4 weeks. Its main symptom is pain, which can be severe. Other symptoms can include fever, headache, chills and upset stomach. Very rarely, a shingles infection can lead to pneumonia, hearing problems, blindness, brain inflammation (encephalitis), or death.  For about 1 person in 5, severe pain can continue even long after the rash has cleared up. This long-lasting pain is called post-herpetic neuralgia (PHN).  Shingles is far more common in people 50 years of age and older than in younger people, and the risk increases with age. It is also more common in people whose immune system is weakened because of a disease such as cancer, or by drugs such as steroids or chemotherapy.  At least 1 million people a year in the [place] get shingles.    2. Shingles vaccine (recombinant)  Recombinant shingles vaccine was approved by FDA in 2017 for the prevention of shingles. In clinical trials, it was more than 90% effective in preventing shingles. It can also reduce the likelihood of PHN.  Two doses, 2 to 6 months apart, are recommended for adults 50 and older.  This vaccine is also recommended for people who have already gotten the live shingles vaccine (Zostavax). There is no live virus in this vaccine.    3. Some people should not get this vaccine  Tell your vaccine provider if you:   Have any  severe, life-threatening allergies. A person who has ever had a life-threatening allergic reaction after a dose of recombinant shingles vaccine, or has a severe allergy to any component of this vaccine, may be advised not to be vaccinated. Ask your health care provider if you want information about vaccine components.   Are pregnant or breastfeeding. There is not much information about use of recombinant shingles vaccine in pregnant or nursing women. Your healthcare provider might recommend delaying vaccination.   Are not feeling well. If you have a mild illness, such as a cold, you can probably get the vaccine today. If you are moderately or severely ill, you should probably wait until you recover. Your doctor can advise you.    4. Risks of a vaccine reaction  With any medicine, including vaccines, there is a chance of reactions.  After recombinant shingles vaccination, a person might experience:   Pain, redness, soreness, or swelling at the site of the injection   Headache, muscle aches, fever, shivering, fatigue    In clinical trials, most people got a sore arm with mild or moderate pain after vaccination, and some also had redness and swelling where they got the shot. Some people felt tired, had muscle pain, a headache, shivering, fever, stomach pain, or nausea. About 1 out of 6 people who got recombinant zoster vaccine experienced side effects that prevented them from doing regular activities. Symptoms went away on their own in about 2 to 3 days. Side effects were more   common in younger people.  You should still get the second dose of recombinant zoster vaccine even if you had one of these reactions after the first dose.    Other things that could happen after this vaccine:   People sometimes faint after medical procedures, including vaccination. Sitting or lying down for about 15 minutes can help prevent fainting and injuries caused by a fall. Tell your provider if you feel dizzy or have vision changes or  ringing in the ears.   Some people get shoulder pain that can be more severe and longer-lasting than routine soreness that can follow injections. This happens very rarely.   Any medication can cause a severe allergic reaction. Such reactions to a vaccine are estimated at about 1 in a million doses, and would happen within a few minutes to a few hours after the vaccination.  As with any medicine, there is a very remote chance of a vaccine causing a serious injury or death.  The safety of vaccines is always being monitored. For more information, visit: www.cdc.gov/vaccinesafety/    5. What if there is a serious problem?  What should I look for?   Look for anything that concerns you, such as signs of a severe allergic reaction, very high fever, or unusual behavior.  Signs of a severe allergic reaction can include hives, swelling of the face and throat, difficulty breathing, a fast heartbeat, dizziness, and weakness. These would usually start a few minutes to a few hours after the vaccination.    What should I do?   If you think it is a severe allergic reaction or other emergency that can't wait, call 9-1-1 and get to the nearest hospital. Otherwise, call your health care provider.  Afterward, the reaction should be reported to the Vaccine Adverse Event Reporting System (VAERS). Your doctor should file this report, or you can do it yourself through the VAERS web site atwww.vaers.hhs.govor by calling 1-800-822-7967.  VAERS does not give medical advice.    6. How can I learn more?   Ask your healthcare provider. He or she can give you the vaccine package insert or suggest other sources of information.   Call your local or state health department.   Contact the Centers for Disease Control and Prevention (CDC):  ? Call 1-800-232-4636 (1-800-CDC-INFO) or  ? Visit the CDC's website at www.cdc.gov/vaccines  ?   CDC Vaccine Information Statement (VIS) Recombinant Zoster Vaccine (04/08/2016)  This information is not  intended to replace advice given to you by your health care provider. Make sure you discuss any questions you have with your health care provider.  Document Released: 04/23/2016 Document Revised: 04/23/2016 Document Reviewed: 04/23/2016  Elsevier Interactive Patient Education  2018 Elsevier Inc.

## 2018-02-02 NOTE — Progress Notes (Signed)
57 y.o. G1P0010 Single Black or [place] American female here for annual exam.  Doing well.  No vaginal bleeding.  Still having hot flashes.  OTC options did not help.  Patient's last menstrual period was 03/19/2017.          Sexually active: Yes.    The current method of family planning is condoms every time.    Exercising: No.   Smoker:  no  Health Maintenance: Pap:  09/19/16 Neg   05/17/14 Neg. HR HPV:neg  History of abnormal Pap:  no MMG:  06/30/17 BIRADS1:neg  Colonoscopy: 04/24/17 f/u 5 years, negative BMD: Never TDaP:  10/2016 Pneumonia vaccine(s):  n/a Shingrix:   Never Hep C testing: unsure  Screening Labs: here today   reports that she has never smoked. She has never used smokeless tobacco. She reports that she does not drink alcohol or use drugs.  Past Medical History:  Diagnosis Date  . Arthritis 2018  . Breast pain, left    Since 02/2015  . CVA (cerebral vascular accident) ([place]) 05/2007  . Galactorrhea     Past Surgical History:  Procedure Laterality Date  . hysteroscopic resection polyp N/A 05/04/2002    Current Outpatient Medications  Medication Sig Dispense Refill  . acetaminophen (ARTHRITIS PAIN RELIEF) 650 MG CR tablet Take 650 mg by mouth every 8 (eight) hours as needed for pain.    Marland Kitchen aspirin 81 MG tablet Take 81 mg by mouth daily.    . Turmeric Curcumin 500 MG CAPS Take 1 capsule by mouth daily.     No current facility-administered medications for this visit.     Family History  Problem Relation Age of Onset  . Hypertension Father   . Heart failure Father   . Diabetes Maternal Aunt   . Lung cancer Maternal Aunt   . Lung cancer Mother 69  . Bone cancer Sister 36  . Diabetes Brother 9    Review of Systems  Musculoskeletal: Positive for myalgias.  All other systems reviewed and are negative.   Exam:   BP 110/60 (BP Location: Right Arm, Patient Position: Sitting, Cuff Size: Large)   Pulse 72   Resp 16   Ht 5' 6.75" (1.695 m)   Wt 161 lb 12.8  oz (73.4 kg)   LMP 03/19/2017   BMI 25.53 kg/m   Height: 5' 6.75" (169.5 cm)  Ht Readings from Last 3 Encounters:  02/02/18 5' 6.75" (1.695 m)  09/12/17 5' 7.5" (1.715 m)  05/22/17 5' 7.25" (1.708 m)    General appearance: alert, cooperative and appears stated age Head: Normocephalic, without obvious abnormality, atraumatic Neck: no adenopathy, supple, symmetrical, trachea midline and thyroid normal to inspection and palpation Lungs: clear to auscultation bilaterally Breasts: normal appearance, no masses or tenderness Heart: regular rate and rhythm Abdomen: soft, non-tender; bowel sounds normal; no masses,  no organomegaly Extremities: extremities normal, atraumatic, no cyanosis or edema Skin: Skin color, texture, turgor normal. No rashes or lesions Lymph nodes: Cervical, supraclavicular, and axillary nodes normal. No abnormal inguinal nodes palpated Neurologic: Grossly normal   Pelvic: External genitalia:  no lesions              Urethra:  normal appearing urethra with no masses, tenderness or lesions              Bartholins and Skenes: normal                 Vagina: normal appearing vagina with normal color and discharge, no lesions  Cervix: no lesions              Pap taken: Yes.   Bimanual Exam:  Uterus:  normal size, contour, position, consistency, mobility, non-tender              Adnexa: normal adnexa and no mass, fullness, tenderness               Rectovaginal: Confirms               Anus:  normal sphincter tone, no lesions  Chaperone was present for exam.  A:  Well Woman with normal exam PMP, no HRT H/O enlarged uterus due to fibroids H/O stroke Fatigue Hot flashes  P:   Mammogram guidelines reviewed.  Doing 3D pap smear guidelines reviewed.  Pap and HR HPV obtiained today CBC, Iron panel obtained today Hep C antibody obtained today Information about shingrix given Trial of gabapentin 100mg  nightly x 7 nights, then 200mg  nightly x 7 nights, then  300mg  nightly.  Pt will call and give update.   Return annually or prn

## 2018-02-03 LAB — CYTOLOGY - PAP
Diagnosis: NEGATIVE
HPV: NOT DETECTED

## 2018-02-03 LAB — CBC
HEMATOCRIT: 38 % (ref 34.0–46.6)
Hemoglobin: 12.5 g/dL (ref 11.1–15.9)
MCH: 26.5 pg — ABNORMAL LOW (ref 26.6–33.0)
MCHC: 32.9 g/dL (ref 31.5–35.7)
MCV: 81 fL (ref 79–97)
PLATELETS: 211 10*3/uL (ref 150–450)
RBC: 4.72 x10E6/uL (ref 3.77–5.28)
RDW: 13.4 % (ref 12.3–15.4)
WBC: 2.7 10*3/uL — AB (ref 3.4–10.8)

## 2018-02-03 LAB — IRON,TIBC AND FERRITIN PANEL
Ferritin: 108 ng/mL (ref 15–150)
Iron Saturation: 39 % (ref 15–55)
Iron: 128 ug/dL (ref 27–159)
Total Iron Binding Capacity: 326 ug/dL (ref 250–450)
UIBC: 198 ug/dL (ref 131–425)

## 2018-02-03 LAB — HEPATITIS C ANTIBODY: Hep C Virus Ab: <0.1 s/co ratio (ref 0.0–0.9)

## 2018-02-10 ENCOUNTER — Telehealth: Payer: Self-pay | Admitting: *Deleted

## 2018-02-10 NOTE — Telephone Encounter (Signed)
LM for pt to call back.

## 2018-02-10 NOTE — Telephone Encounter (Signed)
-----   Message from Megan Salon, MD sent at 02/09/2018  4:14 PM EST ----- Please let pt know her pap was negative and HR HPV testing was negative.  02 recall.  Also let her know her hep c testing was negative as well as her iron studies.  Her CBC did not show any anemia, elevated WBC ct, or elevated platelet count so this is good too.

## 2018-02-11 NOTE — Telephone Encounter (Signed)
Patient returned call for results 

## 2018-02-12 NOTE — Telephone Encounter (Signed)
Patient returned call for results 

## 2018-02-12 NOTE — Telephone Encounter (Signed)
Patient notified

## 2018-05-29 ENCOUNTER — Other Ambulatory Visit: Payer: Self-pay | Admitting: Obstetrics & Gynecology

## 2018-05-29 DIAGNOSIS — Z1231 Encounter for screening mammogram for malignant neoplasm of breast: Secondary | ICD-10-CM

## 2018-07-24 ENCOUNTER — Ambulatory Visit: Payer: BC Managed Care – PPO

## 2018-08-08 ENCOUNTER — Ambulatory Visit
Admission: RE | Admit: 2018-08-08 | Discharge: 2018-08-08 | Disposition: A | Discharge status: Home / Self Care | Payer: BC Managed Care – PPO | Source: Ambulatory Visit | Attending: Obstetrics & Gynecology | Admitting: Obstetrics & Gynecology

## 2018-08-08 ENCOUNTER — Other Ambulatory Visit: Payer: Self-pay

## 2018-08-08 DIAGNOSIS — Z1231 Encounter for screening mammogram for malignant neoplasm of breast: Secondary | ICD-10-CM

## 2019-03-15 ENCOUNTER — Other Ambulatory Visit (HOSPITAL_COMMUNITY)
Admission: RE | Admit: 2019-03-15 | Discharge: 2019-03-15 | Disposition: A | Discharge status: Home / Self Care | Payer: BC Managed Care – PPO | Source: Ambulatory Visit | Attending: Obstetrics & Gynecology | Admitting: Obstetrics & Gynecology

## 2019-03-15 ENCOUNTER — Ambulatory Visit (INDEPENDENT_AMBULATORY_CARE_PROVIDER_SITE_OTHER): Payer: BC Managed Care – PPO | Admitting: Obstetrics & Gynecology

## 2019-03-15 ENCOUNTER — Encounter: Payer: Self-pay | Admitting: Obstetrics & Gynecology

## 2019-03-15 ENCOUNTER — Other Ambulatory Visit: Payer: Self-pay

## 2019-03-15 VITALS — BP 114/62 | HR 64 | Temp 97.1°F | Resp 10 | Ht 66.75 in | Wt 157.0 lb

## 2019-03-15 DIAGNOSIS — Z124 Encounter for screening for malignant neoplasm of cervix: Secondary | ICD-10-CM | POA: Insufficient documentation

## 2019-03-15 DIAGNOSIS — Z01419 Encounter for gynecological examination (general) (routine) without abnormal findings: Secondary | ICD-10-CM

## 2019-03-15 DIAGNOSIS — Z0000 Encounter for general adult medical examination without abnormal findings: Secondary | ICD-10-CM

## 2019-03-15 DIAGNOSIS — R5383 Other fatigue: Secondary | ICD-10-CM

## 2019-03-15 DIAGNOSIS — R7989 Other specified abnormal findings of blood chemistry: Secondary | ICD-10-CM

## 2019-03-15 NOTE — Progress Notes (Signed)
58 y.o. G1P0010 Single Black or [place] American female here for annual exam.  Brother died from [place] on 03-05-2019.  He was 64.  He died at home being picked up from EMS.  Father is still alive.  This has been sad for her family.    Denies vaginal bleeding.    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  She has signed up for the Covid vaccine.    PCP:  Dr. Cari Castillo  Patient's last menstrual period was 03/19/2017.          Sexually active: Yes.    The current method of family planning is post menopausal status.    Exercising: No.  The patient does not participate in regular exercise at present. Smoker:  no  Health Maintenance: Pap:   02/02/18 Neg:Neg HR HPV  09/19/16 Neg              05/17/14 Neg. HR HPV:neg  History of abnormal Pap:  no MMG:  08/10/18 BIRADS 1 negative/density b Colonoscopy:  04/24/17 f/u 5 years, negative BMD:   never TDaP:  2018 Pneumonia vaccine(s):  n/a Shingrix:   Declines today Hep C testing: 02/02/18 Neg Screening Labs: discuss today   reports that she has never smoked. She has never used smokeless tobacco. She reports that she does not drink alcohol or use drugs.  Past Medical History:  Diagnosis Date  . Arthritis 2018  . Breast pain, left    Since 02/2015  . CVA (cerebral vascular accident) ([place]) 05/2007  . Galactorrhea     Past Surgical History:  Procedure Laterality Date  . hysteroscopic resection polyp N/A 05/04/2002    Current Outpatient Medications  Medication Sig Dispense Refill  . acetaminophen (ARTHRITIS PAIN RELIEF) 650 MG CR tablet Take 650 mg by mouth every 8 (eight) hours as needed for pain.    Marland Kitchen aspirin 81 MG tablet Take 81 mg by mouth daily.    . Turmeric Curcumin 500 MG CAPS Take 1 capsule by mouth daily.     No current facility-administered medications for this visit.    Family History  Problem Relation Age of Onset  . Hypertension Father   . Heart failure Father   .  Diabetes Maternal Aunt   . Lung cancer Maternal Aunt   . Lung cancer Mother 76  . Bone cancer Sister 25  . Diabetes Brother 79    Review of Systems  All other systems reviewed and are negative.   Exam:   BP 114/62 (BP Location: Right Arm, Patient Position: Sitting, Cuff Size: Normal)   Pulse 64   Temp (!) 97.1 F (36.2 C) (Temporal)   Resp 10   Ht 5' 6.75" (1.695 m)   Wt 157 lb (71.2 kg)   LMP 03/19/2017   BMI 24.77 kg/m     Height: 5' 6.75" (169.5 cm)  Ht Readings from Last 3 Encounters:  03/15/19 5' 6.75" (1.695 m)  02/02/18 5' 6.75" (1.695 m)  09/12/17 5' 7.5" (1.715 m)   General appearance: alert, cooperative and appears stated age Head: Normocephalic, without obvious abnormality, atraumatic Neck: no adenopathy, supple, symmetrical, trachea midline and thyroid normal to inspection and palpation Lungs: clear to auscultation bilaterally Breasts: normal appearance, no masses or tenderness Heart: regular rate and rhythm Abdomen: soft, non-tender; bowel sounds normal; no masses,  no organomegaly Extremities: extremities normal, atraumatic, no cyanosis or edema Skin: Skin color, texture, turgor normal. No rashes or lesions Lymph  nodes: Cervical, supraclavicular, and axillary nodes normal. No abnormal inguinal nodes palpated Neurologic: Grossly normal   Pelvic: External genitalia:  no lesions              Urethra:  normal appearing urethra with no masses, tenderness or lesions              Bartholins and Skenes: normal                 Vagina: normal appearing vagina with normal color and discharge, no lesions              Cervix: no lesions              Pap taken: Yes.   Bimanual Exam:  Uterus:  normal size, contour, position, consistency, mobility, non-tender              Adnexa: normal adnexa and no mass, fullness, tenderness               Rectovaginal: Confirms               Anus:  normal sphincter tone, no lesions  Chaperone, Terence Lux, CMA, was present for  exam.  A:  Well Woman with normal exam PMP, no HRT H/o enlarged uterus due to fibroids H/o stroke Fatigue Grief reaction Hot flashes.  Again we discussed options for treatment that do not include estrogens.  She will consider.  P:   Mammogram guidelines reviewed. pap smear obtained today.  Pt desires a pap smear.  Neg pap/neg HR HPV 2019.   CMP, CBC with diff, Vit D, TSH and iron studies Colonoscopy UTD Plan BMD after 60 Declines shingrix vaccination this year Return annually or prn

## 2019-03-16 LAB — IRON,TIBC AND FERRITIN PANEL
Ferritin: 95 ng/mL (ref 15–150)
Iron Saturation: 25 % (ref 15–55)
Iron: 91 ug/dL (ref 27–159)
Total Iron Binding Capacity: 359 ug/dL (ref 250–450)
UIBC: 268 ug/dL (ref 131–425)

## 2019-03-16 LAB — COMPREHENSIVE METABOLIC PANEL
ALT: 10 IU/L (ref 0–32)
AST: 17 IU/L (ref 0–40)
Albumin/Globulin Ratio: 1.7 (ref 1.2–2.2)
Albumin: 4.7 g/dL (ref 3.8–4.9)
Alkaline Phosphatase: 113 IU/L (ref 39–117)
BUN/Creatinine Ratio: 16 (ref 9–23)
BUN: 11 mg/dL (ref 6–24)
Bilirubin Total: 0.2 mg/dL (ref 0.0–1.2)
CO2: 25 mmol/L (ref 20–29)
Calcium: 9.9 mg/dL (ref 8.7–10.2)
Chloride: 102 mmol/L (ref 96–106)
Creatinine, Ser: 0.68 mg/dL (ref 0.57–1.00)
GFR calc Af Amer: 112 mL/min/{1.73_m2} (ref >59)
GFR calc non Af Amer: 97 mL/min/{1.73_m2} (ref >59)
Globulin, Total: 2.8 g/dL (ref 1.5–4.5)
Glucose: 64 mg/dL — ABNORMAL LOW (ref 65–99)
Potassium: 4 mmol/L (ref 3.5–5.2)
Sodium: 141 mmol/L (ref 134–144)
Total Protein: 7.5 g/dL (ref 6.0–8.5)

## 2019-03-16 LAB — CBC WITH DIFFERENTIAL/PLATELET
Basophils Absolute: 0 10*3/uL (ref 0.0–0.2)
Basos: 0 %
EOS (ABSOLUTE): 0 10*3/uL (ref 0.0–0.4)
Eos: 1 %
Hematocrit: 38.8 % (ref 34.0–46.6)
Hemoglobin: 12.4 g/dL (ref 11.1–15.9)
Immature Grans (Abs): 0 10*3/uL (ref 0.0–0.1)
Immature Granulocytes: 1 %
Lymphocytes Absolute: 1.6 10*3/uL (ref 0.7–3.1)
Lymphs: 48 %
MCH: 26.3 pg — ABNORMAL LOW (ref 26.6–33.0)
MCHC: 32 g/dL (ref 31.5–35.7)
MCV: 82 fL (ref 79–97)
Monocytes Absolute: 0.2 10*3/uL (ref 0.1–0.9)
Monocytes: 6 %
Neutrophils Absolute: 1.4 10*3/uL (ref 1.4–7.0)
Neutrophils: 44 %
Platelets: 192 10*3/uL (ref 150–450)
RBC: 4.72 x10E6/uL (ref 3.77–5.28)
RDW: 13 % (ref 11.7–15.4)
WBC: 3.2 10*3/uL — ABNORMAL LOW (ref 3.4–10.8)

## 2019-03-16 LAB — CYTOLOGY - PAP: Diagnosis: NEGATIVE

## 2019-03-16 LAB — TSH: TSH: 0.416 u[IU]/mL — ABNORMAL LOW (ref 0.450–4.500)

## 2019-03-16 LAB — VITAMIN D 25 HYDROXY (VIT D DEFICIENCY, FRACTURES): Vit D, 25-Hydroxy: 28.4 ng/mL — ABNORMAL LOW (ref 30.0–100.0)

## 2019-03-25 NOTE — Addendum Note (Signed)
Addended by: Megan Salon on: 03/25/2019 04:06 PM   Modules accepted: Orders

## 2019-03-26 ENCOUNTER — Telehealth: Payer: Self-pay

## 2019-03-26 NOTE — Telephone Encounter (Signed)
-----   Message from Megan Salon, MD sent at 03/25/2019  4:06 PM EST ----- Please call pt.  Pap normal.  02 recall.  Vit D 28.4  goal is >30 so she is right there.  Iron levels were normal.  CBC showed mildly low white cell count but her's has been that way for several years.  It is stable and ok to watch.  She has no anemia.  CMP is fine.  TSH is low showing she may have a mildly over functioning thyroid.  I tried to add on a free t4 to these labs but I couldn't.  Could you please see if she would consider returning at some point in the next 4-6 weeks for a repeat TSH and Free t4.  Orders have been placed.  Thanks.

## 2019-03-26 NOTE — Telephone Encounter (Signed)
Tried calling patient regarding lab results as written below by provider. No answer, left message for patient to return my call at [phone].

## 2019-03-26 NOTE — Telephone Encounter (Signed)
Patient returned call

## 2019-04-02 IMAGING — MG DIGITAL SCREENING BILATERAL MAMMOGRAM WITH TOMO AND CAD
6 of 10 series · 6 of 30 positions shown · non-contrast
Comparison: Previous exam(s).

CLINICAL DATA: Screening.

EXAM:
DIGITAL SCREENING BILATERAL MAMMOGRAM WITH TOMO AND CAD

[R MLO synth-2D (1 of 2)]
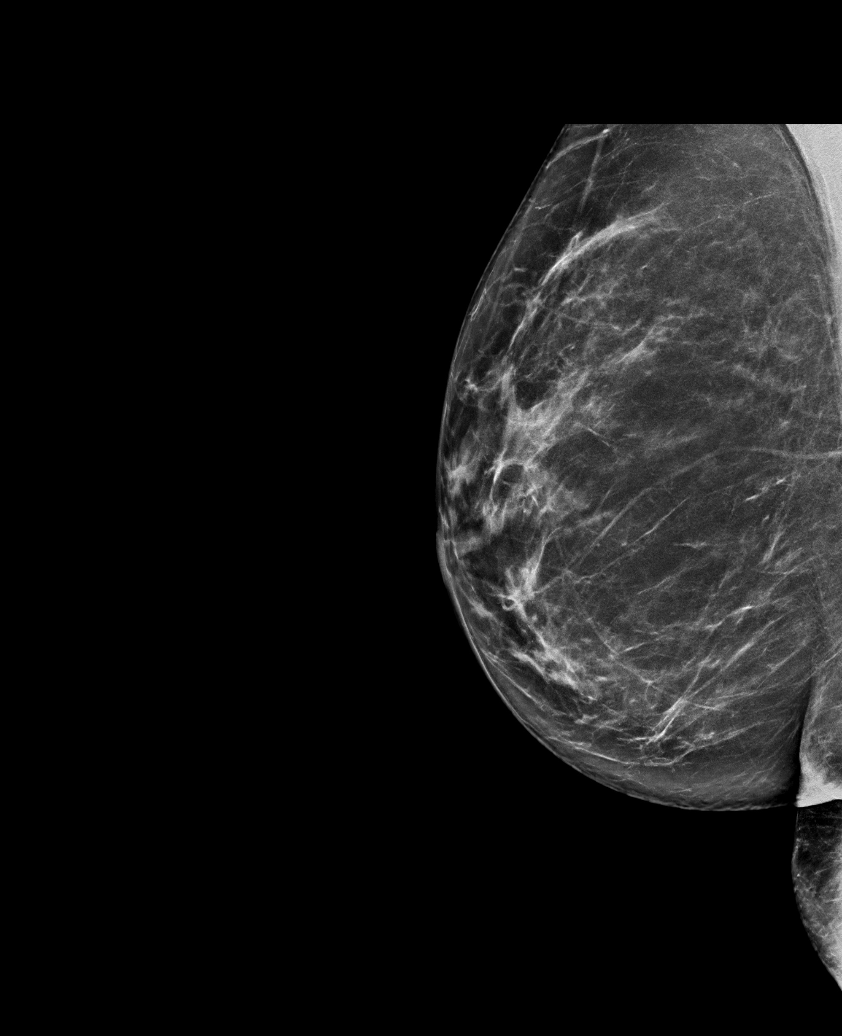

[L CC synth-2D]
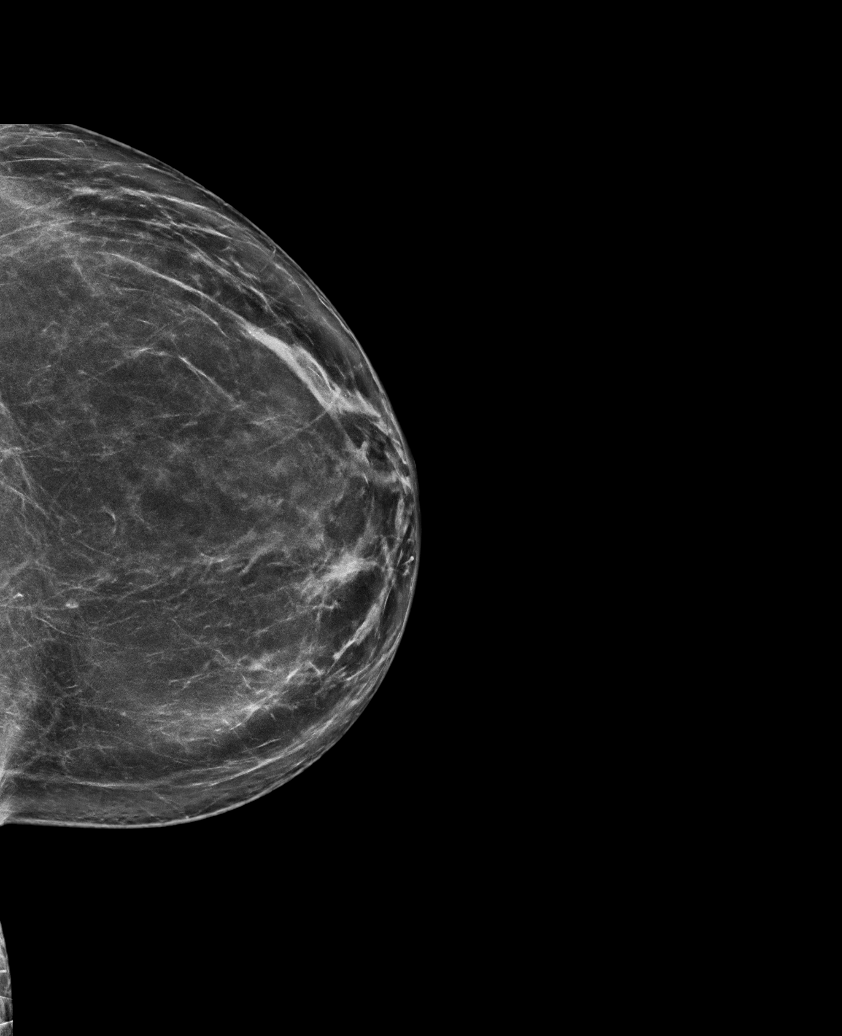

[R CC synth-2D]
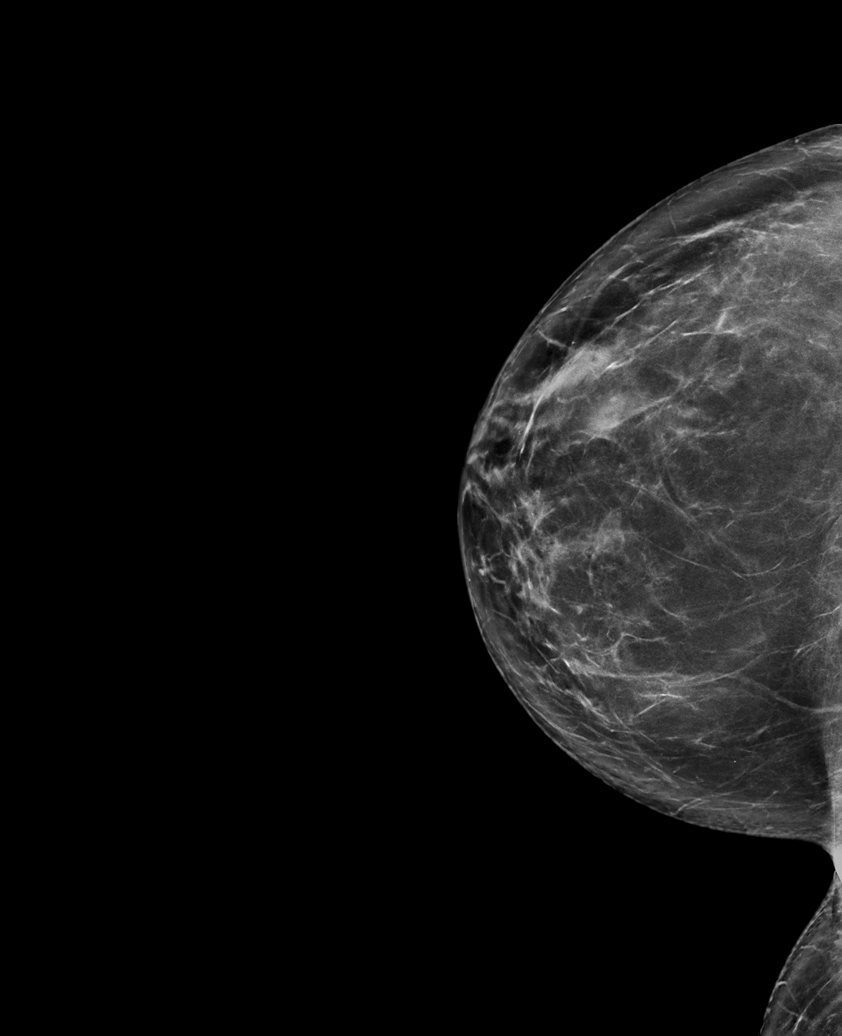

[L MLO synth-2D]
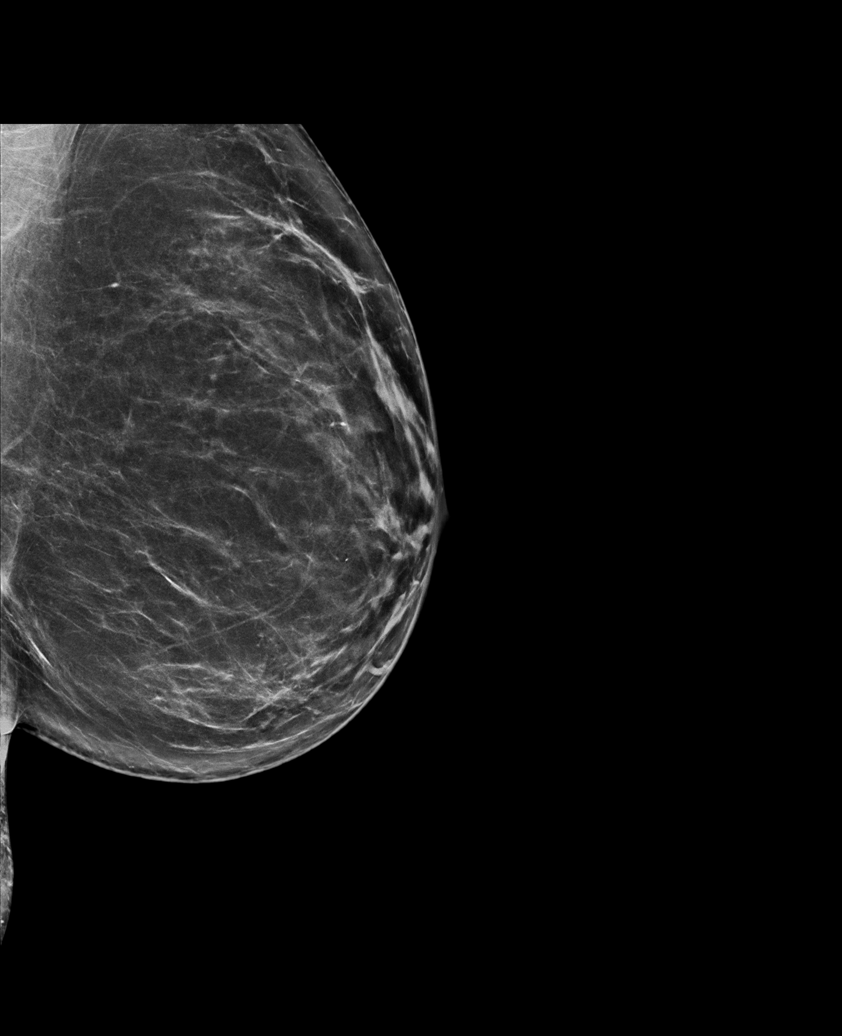

[R MLO synth-2D (2 of 2)]
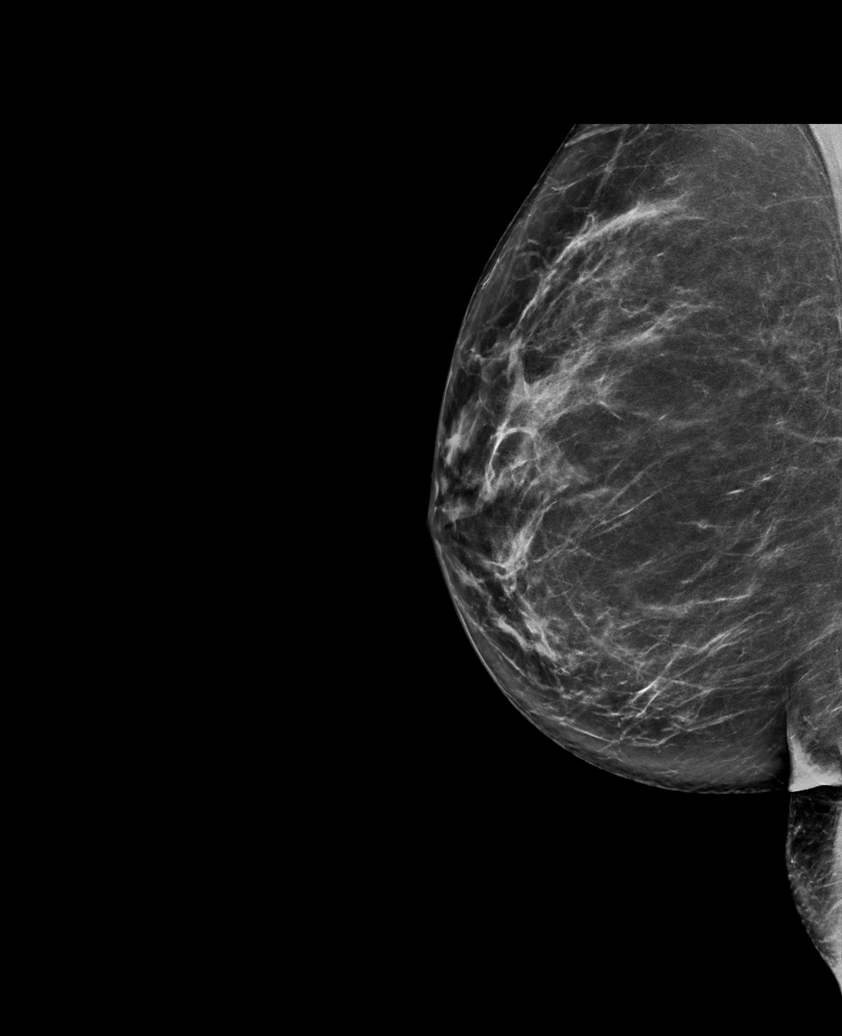

[R MLO tomo · tomo slice 41/82.0]
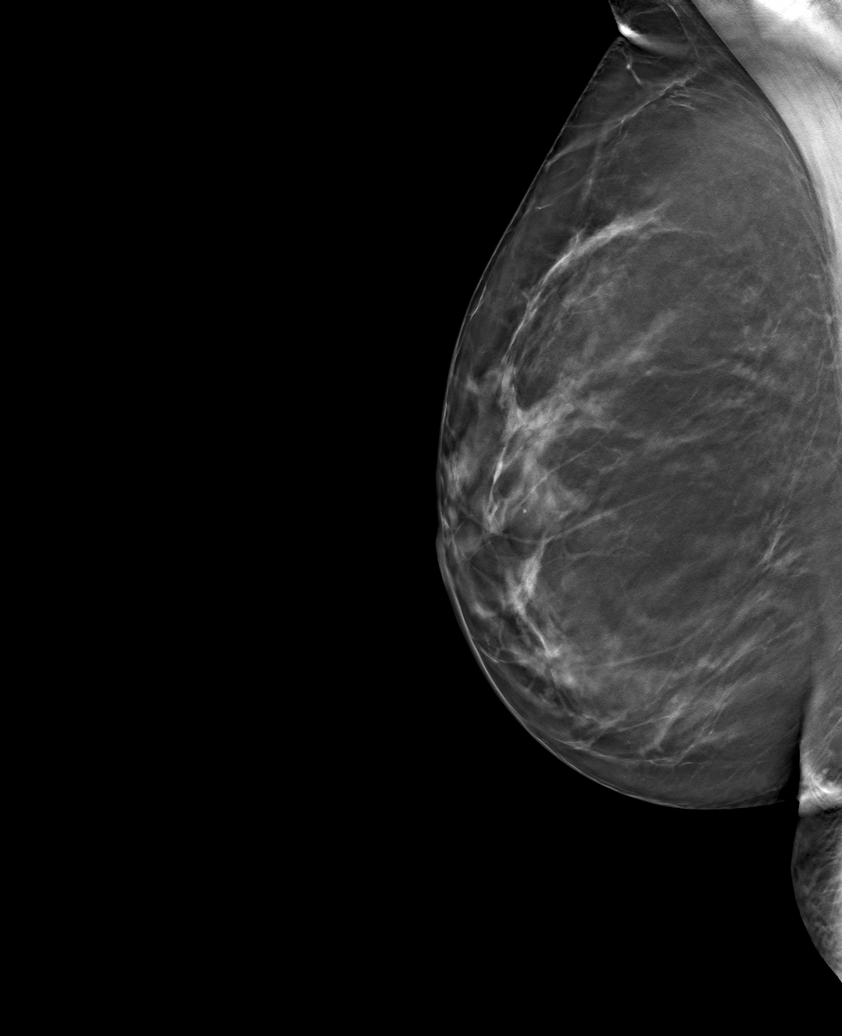

[6 of 30 positions shown; findings below may reference images not displayed]

ACR Breast Density Category b: There are scattered areas of
fibroglandular density.
FINDINGS: There are no findings suspicious for malignancy. Images were
processed with CAD.
IMPRESSION: No mammographic evidence of malignancy. A result letter of this
screening mammogram will be mailed directly to the patient.

RECOMMENDATION:
Screening mammogram in one year. (Code:[id number])

BI-RADS CATEGORY  1: Negative.

## 2019-04-02 NOTE — Telephone Encounter (Signed)
See result note. Results discussed with patient. Closing encounter.

## 2019-04-19 ENCOUNTER — Telehealth: Payer: Self-pay | Admitting: Obstetrics & Gynecology

## 2019-04-19 ENCOUNTER — Other Ambulatory Visit (INDEPENDENT_AMBULATORY_CARE_PROVIDER_SITE_OTHER): Payer: BC Managed Care – PPO

## 2019-04-19 ENCOUNTER — Other Ambulatory Visit: Payer: Self-pay

## 2019-04-19 DIAGNOSIS — R7989 Other specified abnormal findings of blood chemistry: Secondary | ICD-10-CM

## 2019-04-19 NOTE — Telephone Encounter (Signed)
Patient is wondering what vitamins she should be taking?

## 2019-04-19 NOTE — Telephone Encounter (Signed)
Spoke to pt. Pt wanting to know what vitamins to take on a regular basis. Pt advised to take a multivitamin everyday and can take up to a 1000 IU Vit D daily to keep up vitamin D levels. Pt agreeable and thankful for recommendations. Will discuss with Dr Sabra Heck for any additional recommendations and will return call to pt. Pt agreeable.   Routing to Dr Sabra Heck for recommendations.

## 2019-04-20 LAB — TSH: TSH: 0.798 u[IU]/mL (ref 0.450–4.500)

## 2019-04-20 LAB — T4, FREE: Free T4: 1.43 ng/dL (ref 0.82–1.77)

## 2019-04-21 NOTE — Telephone Encounter (Signed)
If pt doesn't get adequate calcium in diet, 3-4 servings daily, should also add 500mg  calcium.  I don't think she needs anything else.  Thanks.

## 2019-04-21 NOTE — Telephone Encounter (Signed)
Spoke with patient, advised as seen below per Dr. Miller. Patient verbalizes understanding and is agreeable.   Encounter closed.  

## 2019-05-17 ENCOUNTER — Encounter: Payer: Self-pay | Admitting: Certified Nurse Midwife

## 2019-05-19 ENCOUNTER — Telehealth: Payer: Self-pay | Admitting: *Deleted

## 2019-05-19 NOTE — Telephone Encounter (Signed)
Spoke with patient. Patient reports pain in left breast for 1 wk. Has reduce caffeine intake and applied warm compresses with no change. Denies lumps, nipple d/c, skin changes, fever/chills. Patient did receive 2nd covid 19 vaccine approximately 2-3 wks ago.   Recommended OV for further evaluation, patient declined OV on 3/26, will be out of town, request to schedule the following week. OV scheduled with covering provider, Dr. Quincy Simmonds, on 3/30 at 4:30pm. Advised patient to continue warm compresses, OTC ibuprofen PRN, return call to office if earlier appt desired. Patient agreeable.   Last MMG 08/10/18  Routing to provider for final review. Patient is agreeable to disposition. Will close encounter.  Cc: Dr. Quincy Simmonds

## 2019-05-19 NOTE — Telephone Encounter (Signed)
Patient is having pain in left breast.

## 2019-05-24 ENCOUNTER — Other Ambulatory Visit: Payer: Self-pay

## 2019-05-25 ENCOUNTER — Encounter: Payer: Self-pay | Admitting: Obstetrics and Gynecology

## 2019-05-25 ENCOUNTER — Ambulatory Visit: Payer: BC Managed Care – PPO | Admitting: Obstetrics and Gynecology

## 2019-05-25 VITALS — BP 120/76 | HR 68 | Temp 97.2°F | Ht 66.75 in | Wt 160.0 lb

## 2019-05-25 DIAGNOSIS — N644 Mastodynia: Secondary | ICD-10-CM

## 2019-05-25 NOTE — Progress Notes (Signed)
GYNECOLOGY  VISIT   HPI: 59 y.o.   Single  African American  female   [id number] with Patient's last menstrual period was 03/19/2017.   here for left breast pain for 1 week.  Pain started as a sticking pain and is sore on the lateral side.  No nipple discharge.  Does not feel any lumps.  States hx of fibrocystic breasts. Hx left breast pain since 02/2015.  States she has eaten chocolate cookies and and soda, which agrevates her breast pain.   She denies a hx of cardiac disease other than a heart murmur.   No HRT.   Received her Moderna Covid vaccine series:  Right arm, 03/30/19 and then 04/27/19.   GYNECOLOGIC HISTORY: Patient's last menstrual period was 03/19/2017. Contraception:  PMP Menopausal hormone therapy:  none Last mammogram:  08/10/18 BIRADS 1 negative/density b Last pap smear:    03-15-19 Neg, 03-14-2110/9/19 Neg:Neg HR HPV, 09/19/16 Neg,  05/17/14 Neg:HR HPV:neg         OB History    Gravida  1   Para  0   Term  0   Preterm  0   AB  1   Living  0     SAB  0   TAB  1   Ectopic  0   Multiple  0   Live Births  0              Patient Active Problem List   Diagnosis Date Noted  . History of stroke 02/16/2014  . Fibroid uterus 05/10/2013  . Unspecified vitamin D deficiency 05/10/2013    Past Medical History:  Diagnosis Date  . Arthritis 2018  . Breast pain, left    Since 02/2015  . CVA (cerebral vascular accident) ([facility]) 05/2007  . Galactorrhea     Past Surgical History:  Procedure Laterality Date  . HYSTEROSCOPY WITH D & C  05/04/2002   with polyp resection    Current Outpatient Medications  Medication Sig Dispense Refill  . acetaminophen (ARTHRITIS PAIN RELIEF) 650 MG CR tablet Take 650 mg by mouth every 8 (eight) hours as needed for pain.    Marland Kitchen aspirin 81 MG tablet Take 81 mg by mouth daily.    . Ca & Phos-Vit D-Mag (CALCIUM) [phone] TABS Take 1 tablet by mouth daily.    . Multiple Vitamin (MULTIVITAMIN) capsule Take 1 capsule by  mouth daily.    . Turmeric Curcumin 500 MG CAPS Take 1 capsule by mouth daily.     No current facility-administered medications for this visit.     ALLERGIES: Bee venom and Latex  Family History  Problem Relation Age of Onset  . Hypertension Father   . Heart failure Father   . Diabetes Maternal Aunt   . Lung cancer Maternal Aunt   . Lung cancer Mother 62  . Bone cancer Sister 12  . Diabetes Brother 76    Social History   Socioeconomic History  . Marital status: Single    Spouse name: Not on file  . Number of children: Not on file  . Years of education: Not on file  . Highest education level: Not on file  Occupational History  . Not on file  Tobacco Use  . Smoking status: Never Smoker  . Smokeless tobacco: Never Used  Substance and Sexual Activity  . Alcohol use: No  . Drug use: No  . Sexual activity: Yes    Partners: Male    Birth control/protection: Condom, Post-menopausal  Other  Topics Concern  . Not on file  Social History Narrative  . Not on file   Social Determinants of Health   Financial Resource Strain:   . Difficulty of Paying Living Expenses:   Food Insecurity:   . Worried About Charity fundraiser in the Last Year:   . Arboriculturist in the Last Year:   Transportation Needs:   . Film/video editor (Medical):   Marland Kitchen Lack of Transportation (Non-Medical):   Physical Activity:   . Days of Exercise per Week:   . Minutes of Exercise per Session:   Stress:   . Feeling of Stress :   Social Connections:   . Frequency of Communication with Friends and Family:   . Frequency of Social Gatherings with Friends and Family:   . Attends Religious Services:   . Active Member of Clubs or Organizations:   . Attends Archivist Meetings:   Marland Kitchen Marital Status:   Intimate Partner Violence:   . Fear of Current or Ex-Partner:   . Emotionally Abused:   Marland Kitchen Physically Abused:   . Sexually Abused:     Review of Systems  All other systems reviewed and are  negative.   PHYSICAL EXAMINATION:    BP 120/76 (Cuff Size: Large)   Pulse 68   Temp (!) 97.2 F (36.2 C) (Temporal)   Ht 5' 6.75" (1.695 m)   Wt 160 lb (72.6 kg)   LMP 03/19/2017   BMI 25.25 kg/m     General appearance: alert, cooperative and appears stated age Head: Normocephalic, without obvious abnormality, atraumatic Lungs: clear to auscultation bilaterally Breasts: normal appearance, no masses or tenderness, No nipple retraction or dimpling, No nipple discharge or bleeding, No axillary or supraclavicular adenopathy Heart:  Subtle murmur noted at left sternal border.   Chaperone was present for exam.  ASSESSMENT  Left breast pain.  Normal breast exam.   PLAN  Reassurance given to patient.  Tylenol po and heat to the breast.  No mammogram recommended at this time.  Fu for annual exam and prn.   An After Visit Summary was printed and given to the patient.  __15____ minutes face to face time of which over 50% was spent in counseling.

## 2019-06-11 ENCOUNTER — Ambulatory Visit: Payer: BC Managed Care – PPO | Admitting: Obstetrics & Gynecology

## 2019-07-07 ENCOUNTER — Other Ambulatory Visit: Payer: Self-pay | Admitting: Obstetrics & Gynecology

## 2019-07-07 DIAGNOSIS — Z1231 Encounter for screening mammogram for malignant neoplasm of breast: Secondary | ICD-10-CM

## 2019-07-29 ENCOUNTER — Telehealth: Payer: Self-pay | Admitting: *Deleted

## 2019-07-29 NOTE — Telephone Encounter (Signed)
Patient want to find out if it is time for her colonoscopy.

## 2019-07-29 NOTE — Telephone Encounter (Signed)
Alison Castillo was referred to GI for positive Cologuard.  Seen by Dr. Katherene Ponto on 04/24/17 for colonoscopy, negative, f/u in 5 yrs. Results scanned into Epic.    Call returned to Alison Castillo. Advised as seen above, should f/u with Dr. Katherene Ponto in 03/2022. Alison Castillo states she received a call from someone reminding her of a colonoscopy, is unsure where the call was from. Alison Castillo has f/u with Dr. Carlena Sax office and it was not them. Advised Alison Castillo no open encounters. I will forward to Dr. Sabra Heck to review, our office will f/u if any additional recommendations. Alison Castillo request MyChart message if any additional recommendations.   Routing to provider for final review. Alison Castillo is agreeable to disposition. Will close encounter.

## 2019-08-11 ENCOUNTER — Ambulatory Visit: Payer: BC Managed Care – PPO

## 2019-09-01 ENCOUNTER — Ambulatory Visit
Admission: RE | Admit: 2019-09-01 | Discharge: 2019-09-01 | Disposition: A | Discharge status: Home / Self Care | Payer: BC Managed Care – PPO | Source: Ambulatory Visit | Attending: Obstetrics & Gynecology | Admitting: Obstetrics & Gynecology

## 2019-09-01 ENCOUNTER — Other Ambulatory Visit: Payer: Self-pay

## 2019-09-01 DIAGNOSIS — Z1231 Encounter for screening mammogram for malignant neoplasm of breast: Secondary | ICD-10-CM

## 2020-03-16 ENCOUNTER — Telehealth: Payer: Self-pay | Admitting: *Deleted

## 2020-03-16 ENCOUNTER — Ambulatory Visit: Payer: BC Managed Care – PPO | Admitting: Obstetrics & Gynecology

## 2020-03-16 ENCOUNTER — Encounter: Payer: Self-pay | Admitting: Obstetrics & Gynecology

## 2020-03-16 ENCOUNTER — Other Ambulatory Visit: Payer: Self-pay

## 2020-03-16 VITALS — BP 126/80

## 2020-03-16 DIAGNOSIS — N644 Mastodynia: Secondary | ICD-10-CM

## 2020-03-16 NOTE — Progress Notes (Signed)
° ° °  SHARIE AMORIN Jun 08, 1960 [id number]        60 y.o.  G1P0010   RP: Itching and tenderness of left breast x a few days  HPI:  Itching and tenderness of left breast x a few days at the upper external quadrant.  No lump felt.  No change in skin.  No nipple discharge.  Last screening mammo negative 08/2019.  No 1st degree relatives with Breast Ca.    OB History  Gravida Para Term Preterm AB Living  1 0 0 0 1 0  SAB IAB Ectopic Multiple Live Births  0 1 0 0 0    # Outcome Date GA Lbr Len/2nd Weight Sex Delivery Anes PTL Lv  1 IAB             Past medical history,surgical history, problem list, medications, allergies, family history and social history were all reviewed and documented in the EPIC chart.   Directed ROS with pertinent positives and negatives documented in the history of present illness/assessment and plan.  Exam:  There were no vitals filed for this visit. General appearance:  Normal  Breast exam:  Rt breast normal.  No Rt axillary LN felt.                         Lt breast: Tenderness/cord shaped lesion, deep at 2 O'Clock, mobile and tender.     Assessment/Plan:  60 y.o. G1P0010   1. Breast pain, left Left breast tenderness/cord shaped lesion on exam deep at 2 O'Clock, mobile and tender.  Refer for left diagnostic mammogram and breast ultrasound.  Patient voiced understanding and agreement with plan.  Princess Bruins MD, 12:17 PM 03/16/2020

## 2020-03-16 NOTE — Telephone Encounter (Signed)
I spoke with patient and informed her of appointment date/time.

## 2020-03-16 NOTE — Telephone Encounter (Signed)
Patient scheduled at the breast center 04/12/20 @ 8:00am. Left message for patient to call.

## 2020-03-16 NOTE — Telephone Encounter (Signed)
-----   Message from Princess Bruins, MD sent at 03/16/2020 12:43 PM EST ----- Regarding: Refer for Lt Dx mammo/US Left breast tenderness/cord shaped lesion on exam deep at 2 O'Clock, mobile and tender.

## 2020-03-17 ENCOUNTER — Encounter: Payer: Self-pay | Admitting: Obstetrics & Gynecology

## 2020-04-12 ENCOUNTER — Ambulatory Visit
Admission: RE | Admit: 2020-04-12 | Discharge: 2020-04-12 | Disposition: A | Discharge status: Home / Self Care | Payer: BC Managed Care – PPO | Source: Ambulatory Visit | Attending: Obstetrics & Gynecology | Admitting: Obstetrics & Gynecology

## 2020-04-12 ENCOUNTER — Other Ambulatory Visit: Payer: Self-pay

## 2020-04-12 DIAGNOSIS — N644 Mastodynia: Secondary | ICD-10-CM

## 2020-05-30 ENCOUNTER — Ambulatory Visit: Payer: BC Managed Care – PPO

## 2020-08-04 ENCOUNTER — Other Ambulatory Visit: Payer: Self-pay | Admitting: Obstetrics & Gynecology

## 2020-08-04 DIAGNOSIS — Z1231 Encounter for screening mammogram for malignant neoplasm of breast: Secondary | ICD-10-CM

## 2020-09-05 ENCOUNTER — Inpatient Hospital Stay: Admission: RE | Admit: 2020-09-05 | Payer: BC Managed Care – PPO | Source: Ambulatory Visit

## 2020-09-05 ENCOUNTER — Ambulatory Visit
Admission: RE | Admit: 2020-09-05 | Discharge: 2020-09-05 | Disposition: A | Discharge status: Home / Self Care | Payer: BC Managed Care – PPO | Source: Ambulatory Visit | Attending: Obstetrics & Gynecology | Admitting: Obstetrics & Gynecology

## 2020-09-05 ENCOUNTER — Other Ambulatory Visit: Payer: Self-pay

## 2020-09-05 DIAGNOSIS — Z1231 Encounter for screening mammogram for malignant neoplasm of breast: Secondary | ICD-10-CM

## 2020-09-08 ENCOUNTER — Other Ambulatory Visit: Payer: Self-pay

## 2020-09-08 ENCOUNTER — Encounter (HOSPITAL_BASED_OUTPATIENT_CLINIC_OR_DEPARTMENT_OTHER): Payer: Self-pay | Admitting: Obstetrics & Gynecology

## 2020-09-08 ENCOUNTER — Ambulatory Visit (INDEPENDENT_AMBULATORY_CARE_PROVIDER_SITE_OTHER): Payer: BC Managed Care – PPO | Admitting: Obstetrics & Gynecology

## 2020-09-08 VITALS — BP 144/80 | HR 55 | Ht 67.0 in | Wt 164.2 lb

## 2020-09-08 DIAGNOSIS — D251 Intramural leiomyoma of uterus: Secondary | ICD-10-CM | POA: Diagnosis not present

## 2020-09-08 DIAGNOSIS — Z780 Asymptomatic menopausal state: Secondary | ICD-10-CM

## 2020-09-08 DIAGNOSIS — Z01419 Encounter for gynecological examination (general) (routine) without abnormal findings: Secondary | ICD-10-CM

## 2020-09-08 DIAGNOSIS — D72819 Decreased white blood cell count, unspecified: Secondary | ICD-10-CM | POA: Diagnosis not present

## 2020-09-08 DIAGNOSIS — Z0000 Encounter for general adult medical examination without abnormal findings: Secondary | ICD-10-CM

## 2020-09-08 DIAGNOSIS — R232 Flushing: Secondary | ICD-10-CM

## 2020-09-08 DIAGNOSIS — E2839 Other primary ovarian failure: Secondary | ICD-10-CM

## 2020-09-08 DIAGNOSIS — Z8673 Personal history of transient ischemic attack (TIA), and cerebral infarction without residual deficits: Secondary | ICD-10-CM

## 2020-09-08 MED ORDER — GABAPENTIN 100 MG PO CAPS: ORAL_CAPSULE | ORAL | 0 refills | Status: DC

## 2020-09-08 NOTE — Progress Notes (Signed)
60 y.o. [place] American female here for annual exam.  Doing well.  Denies vaginal bleeding.  Still having hot flashes but never tried gabapentin.  Was concerned about side effects.  Discussed these today.  She feels more comfortable now trying the gabapentin.  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    Had MRI due to hip issues.  This showed arthritis.  It also showed the uterine fibroids that we know are present.  Last ultrasound was 2019.  Largest fibroid was close to 7.0cm.  The MRI done 08/11/2020 showed fibroids with largest one 6.3cm.    Patient's last menstrual period was 03/19/2017.          Sexually active: Yes.    The current method of family planning is post menopausal status.    Exercising: Yes.     walk Smoker:  no  Health Maintenance: Pap:  03/15/2019 Negative, 2019 pap was neg with eng HR HPV History of abnormal Pap:  no MMG:  09/05/2020 Negative Colonoscopy:  2019, follow up 5 years BMD:   will plan next year TDaP:  2018 Pneumonia vaccine(s):  not indicated Shingrix:   discussed today Hep C testing: 04/05/2017 Screening Labs: Dr. Cari Caraway, last appt was over a year ago   reports that she has never smoked. She has never used smokeless tobacco. She reports that she does not drink alcohol and does not use drugs.  Past Medical History:  Diagnosis Date   Arthritis 2018   Breast pain, left    Since 02/2015   CVA (cerebral vascular accident) ([facility]) 05/2007   Galactorrhea     Past Surgical History:  Procedure Laterality Date   HYSTEROSCOPY WITH D & C  05/04/2002   with polyp resection    Current Outpatient Medications  Medication Sig Dispense Refill   acetaminophen (TYLENOL) 650 MG CR tablet Take 650 mg by mouth every 8 (eight) hours as needed for pain.     aspirin 81 MG tablet Take 81 mg by mouth daily.     Ca & Phos-Vit D-Mag (CALCIUM) [phone] TABS Take 1  tablet by mouth daily.     Multiple Vitamin (MULTIVITAMIN) capsule Take 1 capsule by mouth daily.     Turmeric Curcumin 500 MG CAPS Take 1 capsule by mouth daily.     gabapentin (NEURONTIN) 100 MG capsule Take 1 capsule nightly x 3 nights.  Can increase by 100mg  every 3 nights as needed. 60 capsule 0   No current facility-administered medications for this visit.    Family History  Problem Relation Age of Onset   Hypertension Father    Heart failure Father    Diabetes Maternal Aunt    Lung cancer Maternal Aunt    Lung cancer Mother 64   Bone cancer Sister 19   Diabetes Brother 61    Review of Systems  All other systems reviewed and are negative.  Exam:   BP (!) 144/80 (BP Location: Right Arm, Patient Position: Sitting, Cuff Size: Small)   Pulse (!) 55   Ht 5\' 7"  (1.702 m)   Wt 164 lb 3.2 oz (74.5 kg)   LMP 03/19/2017   BMI 25.72 kg/m   Height: 5\' 7"  (170.2 cm)  General appearance: alert, cooperative and appears stated age Head: Normocephalic, without obvious abnormality, atraumatic Neck: no adenopathy, supple, symmetrical, trachea midline and thyroid normal to inspection and palpation Lungs: clear to  auscultation bilaterally Breasts: normal appearance, no masses or tenderness Heart: regular rate and rhythm Abdomen: soft, non-tender; bowel sounds normal; no masses,  no organomegaly Extremities: extremities normal, atraumatic, no cyanosis or edema Skin: Skin color, texture, turgor normal. No rashes or lesions Lymph nodes: Cervical, supraclavicular, and axillary nodes normal. No abnormal inguinal nodes palpated Neurologic: Grossly normal   Pelvic: External genitalia:  no lesions              Urethra:  normal appearing urethra with no masses, tenderness or lesions              Bartholins and Skenes: normal                 Vagina: normal appearing vagina with normal color and no discharge, no lesions              Cervix: no lesions              Pap taken: No. Bimanual  Exam:  Uterus:   enlarged and globular, about 12 weeks, mobile and non tender              Adnexa: normal adnexa               Rectovaginal: Confirms               Anus:  normal sphincter tone, no lesions  Chaperone, Octaviano Batty, CMA, was present for exam.  Assessment/Plan: 1. Well woman exam with routine gynecological exam - Pap smear obtained 03/15/2019 (neg) and 2018 with neg HR HPV.  Not indicated today.  Plan to repeat with HR HPV next year - MMG 09/05/2020 - colonoscopy 2019, follow up 5 years - BMD ordered for next year with MMG - CBC, CMP, lipids, TSH obtained today - vaccines updated  2. Hypoestrogenemia - DG BONE DENSITY (DXA); Future  3. Intramural leiomyoma of uterus - stable one exam  4. History of stroke  5. Postmenopausal - no HRT  8. Hot flashes - gabapentin (NEURONTIN) 100 MG capsule; Take 1 capsule nightly x 3 nights.  Can increase by 100mg  every 3 nights as needed.  Dispense: 60 capsule; Refill: 0

## 2020-09-09 LAB — CBC WITH DIFFERENTIAL/PLATELET
Basophils Absolute: 0 10*3/uL (ref 0.0–0.2)
Basos: 1 %
EOS (ABSOLUTE): 0.1 10*3/uL (ref 0.0–0.4)
Eos: 3 %
Hematocrit: 39.4 % (ref 34.0–46.6)
Hemoglobin: 12.6 g/dL (ref 11.1–15.9)
Immature Grans (Abs): 0 10*3/uL (ref 0.0–0.1)
Immature Granulocytes: 0 %
Lymphocytes Absolute: 1.3 10*3/uL (ref 0.7–3.1)
Lymphs: 52 %
MCH: 26.2 pg — ABNORMAL LOW (ref 26.6–33.0)
MCHC: 32 g/dL (ref 31.5–35.7)
MCV: 82 fL (ref 79–97)
Monocytes Absolute: 0.2 10*3/uL (ref 0.1–0.9)
Monocytes: 7 %
Neutrophils Absolute: 0.9 10*3/uL — ABNORMAL LOW (ref 1.4–7.0)
Neutrophils: 37 %
Platelets: 185 10*3/uL (ref 150–450)
RBC: 4.81 x10E6/uL (ref 3.77–5.28)
RDW: 13.1 % (ref 11.7–15.4)
WBC: 2.4 10*3/uL — CL (ref 3.4–10.8)

## 2020-09-09 LAB — COMPREHENSIVE METABOLIC PANEL
ALT: 13 IU/L (ref 0–32)
AST: 23 IU/L (ref 0–40)
Albumin/Globulin Ratio: 1.6 (ref 1.2–2.2)
Albumin: 4.6 g/dL (ref 3.8–4.9)
Alkaline Phosphatase: 103 IU/L (ref 44–121)
BUN/Creatinine Ratio: 15 (ref 12–28)
BUN: 11 mg/dL (ref 8–27)
Bilirubin Total: 0.3 mg/dL (ref 0.0–1.2)
CO2: 21 mmol/L (ref 20–29)
Calcium: 9.7 mg/dL (ref 8.7–10.3)
Chloride: 102 mmol/L (ref 96–106)
Creatinine, Ser: 0.72 mg/dL (ref 0.57–1.00)
Globulin, Total: 2.9 g/dL (ref 1.5–4.5)
Glucose: 75 mg/dL (ref 65–99)
Potassium: 4.1 mmol/L (ref 3.5–5.2)
Sodium: 141 mmol/L (ref 134–144)
Total Protein: 7.5 g/dL (ref 6.0–8.5)
eGFR: 96 mL/min/{1.73_m2} (ref >59)

## 2020-09-09 LAB — LIPID PANEL
Chol/HDL Ratio: 3.2 ratio (ref 0.0–4.4)
Cholesterol, Total: 207 mg/dL — ABNORMAL HIGH (ref 100–199)
HDL: 64 mg/dL (ref >39)
LDL Chol Calc (NIH): 131 mg/dL — ABNORMAL HIGH (ref 0–99)
Triglycerides: 65 mg/dL (ref 0–149)
VLDL Cholesterol Cal: 12 mg/dL (ref 5–40)

## 2020-09-10 ENCOUNTER — Encounter (HOSPITAL_BASED_OUTPATIENT_CLINIC_OR_DEPARTMENT_OTHER): Payer: Self-pay

## 2020-09-15 ENCOUNTER — Other Ambulatory Visit (HOSPITAL_BASED_OUTPATIENT_CLINIC_OR_DEPARTMENT_OTHER): Payer: Self-pay | Admitting: Obstetrics & Gynecology

## 2020-09-15 DIAGNOSIS — D709 Neutropenia, unspecified: Secondary | ICD-10-CM

## 2020-09-18 ENCOUNTER — Telehealth: Payer: Self-pay | Admitting: Oncology

## 2020-09-18 NOTE — Telephone Encounter (Signed)
Scheduled appt per referral. Pt aware.  

## 2020-10-12 ENCOUNTER — Inpatient Hospital Stay: Payer: BC Managed Care – PPO

## 2020-10-12 ENCOUNTER — Inpatient Hospital Stay: Payer: BC Managed Care – PPO | Admitting: Oncology

## 2020-10-24 ENCOUNTER — Other Ambulatory Visit: Payer: Self-pay | Admitting: Nurse Practitioner

## 2020-10-24 DIAGNOSIS — D709 Neutropenia, unspecified: Secondary | ICD-10-CM

## 2020-11-01 ENCOUNTER — Inpatient Hospital Stay: Payer: BC Managed Care – PPO | Attending: Oncology

## 2020-11-01 ENCOUNTER — Other Ambulatory Visit: Payer: Self-pay

## 2020-11-01 ENCOUNTER — Inpatient Hospital Stay: Payer: BC Managed Care – PPO | Admitting: Oncology

## 2020-11-01 VITALS — BP 136/71 | HR 65 | Temp 97.8°F | Resp 18 | Ht 67.0 in | Wt 166.4 lb

## 2020-11-01 DIAGNOSIS — D649 Anemia, unspecified: Secondary | ICD-10-CM | POA: Insufficient documentation

## 2020-11-01 DIAGNOSIS — D709 Neutropenia, unspecified: Secondary | ICD-10-CM | POA: Insufficient documentation

## 2020-11-01 LAB — CBC WITH DIFFERENTIAL (CANCER CENTER ONLY)
Abs Immature Granulocytes: 0.02 10*3/uL (ref 0.00–0.07)
Basophils Absolute: 0 10*3/uL (ref 0.0–0.1)
Basophils Relative: 1 %
Eosinophils Absolute: 0 10*3/uL (ref 0.0–0.5)
Eosinophils Relative: 1 %
HCT: 36.1 % (ref 36.0–46.0)
Hemoglobin: 11.3 g/dL — ABNORMAL LOW (ref 12.0–15.0)
Immature Granulocytes: 1 %
Lymphocytes Relative: 43 %
Lymphs Abs: 1.2 10*3/uL (ref 0.7–4.0)
MCH: 25.9 pg — ABNORMAL LOW (ref 26.0–34.0)
MCHC: 31.3 g/dL (ref 30.0–36.0)
MCV: 82.6 fL (ref 80.0–100.0)
Monocytes Absolute: 0.2 10*3/uL (ref 0.1–1.0)
Monocytes Relative: 7 %
Neutro Abs: 1.4 10*3/uL — ABNORMAL LOW (ref 1.7–7.7)
Neutrophils Relative %: 47 %
Platelet Count: 181 10*3/uL (ref 150–400)
RBC: 4.37 MIL/uL (ref 3.87–5.11)
RDW: 13.3 % (ref 11.5–15.5)
WBC Count: 2.9 10*3/uL — ABNORMAL LOW (ref 4.0–10.5)
nRBC: 0 % (ref 0.0–0.2)

## 2020-11-01 LAB — SAVE SMEAR(SSMR), FOR PROVIDER SLIDE REVIEW

## 2020-11-01 NOTE — Progress Notes (Addendum)
[facility] New Patient Consult   Requesting MD: Cari Caraway, [place],  [place] [address]   Alison Castillo 60 y.o.  1961-02-06    Reason for Consult: Neutropenia   HPI: Alison Castillo reports having a routine lab draw by Alison Castillo in July.  The CBC found a white count of 2.4, hemoglobin 0.6, and platelets 185,000.  The neutrophil count returned at 0.9.  Alison Castillo reports a history of anemia in the past.  She has symptoms when her hemoglobin is low.  She does not feel anemic at present.  Review of the electronic medical record found the hemoglobin at 12.4, white count at 3.2 with a neutrophil count of 1.4 on 03/15/2019.  The total white count was low at 3.8 in March 2016.  She feels well.  She is not aware of a history of leukopenia.  She had COVID-19 on 10/02/2020 manifested by a cough, mild headache, rhinorrhea, and dyspnea.  The symptoms have resolved.  No bleeding.  She had a negative colonoscopy in 2019. Past Medical History:  Diagnosis Date   Arthritis 2018   Breast pain, left    Since 02/2015   CVA (cerebral vascular accident) ([facility]) 05/2007   Galactorrhea     .  G4, P2, AB 2, amenorrheic since age 81, hot flashes for the past 2 years   .  Uterine fibroids  Past Surgical History:  Procedure Laterality Date   HYSTEROSCOPY WITH D & C  05/04/2002   with polyp resection    Medications: Reviewed  Allergies:  Allergies  Allergen Reactions   Bee Venom    Latex     Family history: Her mother had lung cancer and was a smoker, her father had prostate cancer, a sister has multiple myeloma, a brother had colon cancer.  Social History:   She lives alone in [place].  She is a Radio producer.  She does not use cigarettes or alcohol.  No transfusion history.  No risk factor for HIV or hepatitis.  She has received COVID-19 vaccines.  ROS:   Positives include: "Arthritis "in the right hip, left hand, and one of her knees, hot flashes for the  past 2 years  A complete ROS was otherwise negative.  Physical Exam:  Blood pressure 136/71, pulse 65, temperature 97.8 F (36.6 C), temperature source Oral, resp. rate 18, height $RemoveBe'5\' 7"'ooRbQQRQr$  (1.702 m), weight 166 lb 6.4 oz (75.5 kg), last menstrual period 03/19/2017, SpO2 100 %.  HEENT: Oropharynx without visible mass, neck without mass Lungs: Clear bilaterally Cardiac: Regular rate and rhythm Abdomen: No hepatosplenomegaly, no mass, nontender  Vascular: No leg edema Lymph nodes: No cervical, supraclavicular, axillary, or inguinal nodes Neurologic: Alert and oriented, the motor exam appears intact in the upper and lower extremities bilaterally Skin: No rash Musculoskeletal: No spine tenderness   LAB:  CBC  Lab Results  Component Value Date   WBC 2.9 (L) 11/01/2020   HGB 11.3 (L) 11/01/2020   HCT 36.1 11/01/2020   MCV 82.6 11/01/2020   PLT 181 11/01/2020   NEUTROABS 1.4 (L) 11/01/2020    Blood smear: The platelets appear normal in number, no large platelet clumps.  The white cells are mostly mature neutrophils and lymphocytes.  I saw one  5 lobed neutrophil, no blasts or other young forms.  The Red cell morphology cannot be evaluated due to extensive water/stain artifact. Addendum: We received a second blood smear for review today.  The platelets appear mildly decreased  in number, no platelet clumps.  The white cells are mostly mature neutrophils and lymphocytes.  No blasts or other young forms are seen.  There are a few ovalocytes.  The red cells are otherwise unremarkable.   CMP  Lab Results  Component Value Date   NA 141 09/08/2020   K 4.1 09/08/2020   CL 102 09/08/2020   CO2 21 09/08/2020   GLUCOSE 75 09/08/2020   BUN 11 09/08/2020   CREATININE 0.72 09/08/2020   CALCIUM 9.7 09/08/2020   PROT 7.5 09/08/2020   ALBUMIN 4.6 09/08/2020   AST 23 09/08/2020   ALT 13 09/08/2020   ALKPHOS 103 09/08/2020   BILITOT 0.3 09/08/2020   GFRNONAA 97 03/15/2019   GFRAA 112  03/15/2019        Assessment/Plan:   Mild neutropenia Total white count low dating at least to 2016  Mild normocytic anemia Uterine fibroids History of a CVA, maintained on aspirin COVID-19 10/02/2020   Disposition:   Alison Castillo is referred for evaluation of neutropenia.  The neutropenia is most likely benign normal variant, or possibly autoimmune neutropenia.  I have a low clinical suspicion for a primary hematologic diagnosis accounting for the neutropenia.  We will obtain a myeloma panel and serum light chains today.  We will also check a vitamin B12 level.  We will check serum iron studies.  I will request another peripheral blood smear for review later today.   Alison Castillo will return for an office visit and CBC in 4 months.  We will initiate additional diagnostic evaluation if she develops progressive neutropenia or other hematologic abnormalities.    Alison Coder, MD  11/01/2020, 10:35 AM

## 2020-11-24 ENCOUNTER — Telehealth: Payer: Self-pay | Admitting: *Deleted

## 2020-11-24 NOTE — Telephone Encounter (Signed)
Per Dr. Benay Spice: OK to have missed labs from 9/7 completed in next few weeks or can add to appointment in January. Left VM for her to call scheduling if she wishes to have labs sooner than January.

## 2021-03-12 ENCOUNTER — Inpatient Hospital Stay: Payer: BC Managed Care – PPO

## 2021-03-12 ENCOUNTER — Encounter: Payer: Self-pay | Admitting: Oncology

## 2021-03-12 ENCOUNTER — Inpatient Hospital Stay: Payer: BC Managed Care – PPO | Attending: Oncology | Admitting: Oncology

## 2021-03-12 ENCOUNTER — Other Ambulatory Visit: Payer: Self-pay

## 2021-03-12 VITALS — BP 136/75 | HR 71 | Temp 97.8°F | Resp 20 | Ht 67.0 in | Wt 167.2 lb

## 2021-03-12 DIAGNOSIS — Z8673 Personal history of transient ischemic attack (TIA), and cerebral infarction without residual deficits: Secondary | ICD-10-CM | POA: Diagnosis not present

## 2021-03-12 DIAGNOSIS — D709 Neutropenia, unspecified: Secondary | ICD-10-CM | POA: Diagnosis not present

## 2021-03-12 DIAGNOSIS — D259 Leiomyoma of uterus, unspecified: Secondary | ICD-10-CM | POA: Diagnosis not present

## 2021-03-12 DIAGNOSIS — D649 Anemia, unspecified: Secondary | ICD-10-CM | POA: Insufficient documentation

## 2021-03-12 LAB — CBC WITH DIFFERENTIAL (CANCER CENTER ONLY)
Abs Immature Granulocytes: 0.01 10*3/uL (ref 0.00–0.07)
Basophils Absolute: 0 10*3/uL (ref 0.0–0.1)
Basophils Relative: 1 %
Eosinophils Absolute: 0 10*3/uL (ref 0.0–0.5)
Eosinophils Relative: 1 %
HCT: 38.1 % (ref 36.0–46.0)
Hemoglobin: 11.8 g/dL — ABNORMAL LOW (ref 12.0–15.0)
Immature Granulocytes: 0 %
Lymphocytes Relative: 53 %
Lymphs Abs: 1.5 10*3/uL (ref 0.7–4.0)
MCH: 25.8 pg — ABNORMAL LOW (ref 26.0–34.0)
MCHC: 31 g/dL (ref 30.0–36.0)
MCV: 83.2 fL (ref 80.0–100.0)
Monocytes Absolute: 0.2 10*3/uL (ref 0.1–1.0)
Monocytes Relative: 7 %
Neutro Abs: 1.1 10*3/uL — ABNORMAL LOW (ref 1.7–7.7)
Neutrophils Relative %: 38 %
Platelet Count: 196 10*3/uL (ref 150–400)
RBC: 4.58 MIL/uL (ref 3.87–5.11)
RDW: 13.3 % (ref 11.5–15.5)
WBC Count: 2.8 10*3/uL — ABNORMAL LOW (ref 4.0–10.5)
nRBC: 0 % (ref 0.0–0.2)

## 2021-03-12 LAB — FERRITIN: Ferritin: 54 ng/mL (ref 11–307)

## 2021-03-12 LAB — IRON AND TIBC
Iron: 72 ug/dL (ref 28–170)
Saturation Ratios: 17 % (ref 10.4–31.8)
TIBC: 417 ug/dL (ref 250–450)
UIBC: 345 ug/dL

## 2021-03-12 LAB — VITAMIN B12: Vitamin B-12: 729 pg/mL (ref 180–914)

## 2021-03-12 NOTE — Progress Notes (Signed)
°  [facility] OFFICE PROGRESS NOTE   Diagnosis: Neutropenia, anemia  INTERVAL HISTORY:   Alison Castillo returns as scheduled.  She feels well.  No fever.  She has hot flashes from menopause.  Good appetite.  No recent infection.  She has "arthritis "at the left knee and left fourth finger.  She takes Tylenol as needed.  Objective:  Vital signs in last 24 hours:  Blood pressure 136/75, pulse 71, temperature 97.8 F (36.6 C), temperature source Oral, resp. rate 20, height 5\' 7"  (1.702 m), weight 167 lb 3.2 oz (75.8 kg), last menstrual period 03/19/2017, SpO2 100 %.    HEENT: Neck without mass Lymphatics: No cervical, supraclavicular, axillary, or inguinal nodes Resp: Lungs clear bilaterally Cardio: Regular rate and rhythm GI: No hepatosplenomegaly Vascular: No leg edema Musculoskeletal: Synovial hypertrophy at the DIP joint of the left fourth finger, left knee without edema     Lab Results:  Lab Results  Component Value Date   WBC 2.8 (L) 03/12/2021   HGB 11.8 (L) 03/12/2021   HCT 38.1 03/12/2021   MCV 83.2 03/12/2021   PLT 196 03/12/2021   NEUTROABS 1.1 (L) 03/12/2021    CMP  Lab Results  Component Value Date   NA 141 09/08/2020   K 4.1 09/08/2020   CL 102 09/08/2020   CO2 21 09/08/2020   GLUCOSE 75 09/08/2020   BUN 11 09/08/2020   CREATININE 0.72 09/08/2020   CALCIUM 9.7 09/08/2020   PROT 7.5 09/08/2020   ALBUMIN 4.6 09/08/2020   AST 23 09/08/2020   ALT 13 09/08/2020   ALKPHOS 103 09/08/2020   BILITOT 0.3 09/08/2020   GFRNONAA 97 03/15/2019   GFRAA 112 03/15/2019     Medications: I have reviewed the patient's current medications.   Assessment/Plan: Mild neutropenia Total white count low dating at least to 2016  Mild normocytic anemia Uterine fibroids History of a CVA, maintained on aspirin COVID-19 10/02/2020     Disposition: Alison Castillo has persistent mild neutropenia and anemia.  We obtained a myeloma panel,, ferritin, vitamin B12,  ANA, and rheumatoid factor today.  She reports a family history of rheumatoid arthritis.  The mild anemia and neutropenia could be related to a collagen vascular disease.  I have a low clinical suspicion for a lymphoproliferative disorder.  She will seek medical attention for symptoms of an infection.  She will return for an office visit and CBC in 6 months.  Betsy Coder, MD  03/12/2021  8:20 AM

## 2021-03-13 LAB — KAPPA/LAMBDA LIGHT CHAINS
Kappa free light chain: 32.6 mg/L — ABNORMAL HIGH (ref 3.3–19.4)
Kappa, lambda light chain ratio: 2.04 — ABNORMAL HIGH (ref 0.26–1.65)
Lambda free light chains: 16 mg/L (ref 5.7–26.3)

## 2021-03-13 LAB — RHEUMATOID FACTOR: Rheumatoid fact SerPl-aCnc: <10 IU/mL (ref <14.0)

## 2021-03-13 LAB — ANTINUCLEAR ANTIBODIES, IFA: ANA Ab, IFA: NEGATIVE

## 2021-03-15 ENCOUNTER — Encounter: Payer: Self-pay | Admitting: Oncology

## 2021-03-16 LAB — MULTIPLE MYELOMA PANEL, SERUM
Albumin SerPl Elph-Mcnc: 4 g/dL (ref 2.9–4.4)
Albumin/Glob SerPl: 1.2 (ref 0.7–1.7)
Alpha 1: 0.2 g/dL (ref 0.0–0.4)
Alpha2 Glob SerPl Elph-Mcnc: 0.6 g/dL (ref 0.4–1.0)
B-Globulin SerPl Elph-Mcnc: 1.1 g/dL (ref 0.7–1.3)
Gamma Glob SerPl Elph-Mcnc: 1.6 g/dL (ref 0.4–1.8)
Globulin, Total: 3.6 g/dL (ref 2.2–3.9)
IgA: 347 mg/dL (ref 87–352)
IgG (Immunoglobin G), Serum: 1513 mg/dL (ref 586–1602)
IgM (Immunoglobulin M), Srm: 87 mg/dL (ref 26–217)
Total Protein ELP: 7.6 g/dL (ref 6.0–8.5)

## 2021-03-19 ENCOUNTER — Telehealth: Payer: Self-pay

## 2021-03-19 NOTE — Telephone Encounter (Signed)
-----   Message from Ladell Pier, MD sent at 03/19/2021  8:04 AM EST ----- Please call patient, laboratory tests for myeloma and rheumatoid arthritis are negative, follow-up as scheduled

## 2021-03-19 NOTE — Telephone Encounter (Signed)
Called and left voicemail for patient regarding information listed below.  Instructed patient to call office with any questions or concerns. Reminded patient of upcoming appointment scheduled for 09/14/21.

## 2021-07-25 ENCOUNTER — Other Ambulatory Visit: Payer: Self-pay | Admitting: Obstetrics & Gynecology

## 2021-07-25 DIAGNOSIS — Z1231 Encounter for screening mammogram for malignant neoplasm of breast: Secondary | ICD-10-CM

## 2021-09-14 ENCOUNTER — Ambulatory Visit
Admission: RE | Admit: 2021-09-14 | Discharge: 2021-09-14 | Disposition: A | Discharge status: Home / Self Care | Payer: BC Managed Care – PPO | Source: Ambulatory Visit | Attending: Obstetrics & Gynecology | Admitting: Obstetrics & Gynecology

## 2021-09-14 ENCOUNTER — Inpatient Hospital Stay: Payer: BC Managed Care – PPO

## 2021-09-14 ENCOUNTER — Ambulatory Visit: Payer: BC Managed Care – PPO

## 2021-09-14 ENCOUNTER — Inpatient Hospital Stay: Payer: BC Managed Care – PPO | Admitting: Oncology

## 2021-09-14 DIAGNOSIS — Z1231 Encounter for screening mammogram for malignant neoplasm of breast: Secondary | ICD-10-CM

## 2021-09-20 ENCOUNTER — Inpatient Hospital Stay: Payer: BC Managed Care – PPO | Admitting: Oncology

## 2021-09-20 ENCOUNTER — Inpatient Hospital Stay: Payer: BC Managed Care – PPO | Attending: Oncology

## 2021-09-20 VITALS — BP 126/82 | HR 63 | Temp 98.2°F | Resp 18 | Ht 67.0 in | Wt 171.4 lb

## 2021-09-20 DIAGNOSIS — Z7982 Long term (current) use of aspirin: Secondary | ICD-10-CM | POA: Diagnosis not present

## 2021-09-20 DIAGNOSIS — D259 Leiomyoma of uterus, unspecified: Secondary | ICD-10-CM | POA: Diagnosis not present

## 2021-09-20 DIAGNOSIS — N951 Menopausal and female climacteric states: Secondary | ICD-10-CM | POA: Insufficient documentation

## 2021-09-20 DIAGNOSIS — D709 Neutropenia, unspecified: Secondary | ICD-10-CM

## 2021-09-20 DIAGNOSIS — Z8616 Personal history of COVID-19: Secondary | ICD-10-CM | POA: Insufficient documentation

## 2021-09-20 DIAGNOSIS — Z862 Personal history of diseases of the blood and blood-forming organs and certain disorders involving the immune mechanism: Secondary | ICD-10-CM | POA: Insufficient documentation

## 2021-09-20 DIAGNOSIS — Z8673 Personal history of transient ischemic attack (TIA), and cerebral infarction without residual deficits: Secondary | ICD-10-CM | POA: Insufficient documentation

## 2021-09-20 LAB — CBC WITH DIFFERENTIAL (CANCER CENTER ONLY)
Abs Immature Granulocytes: 0.01 10*3/uL (ref 0.00–0.07)
Basophils Absolute: 0 10*3/uL (ref 0.0–0.1)
Basophils Relative: 1 %
Eosinophils Absolute: 0 10*3/uL (ref 0.0–0.5)
Eosinophils Relative: 1 %
HCT: 38.7 % (ref 36.0–46.0)
Hemoglobin: 12.3 g/dL (ref 12.0–15.0)
Immature Granulocytes: 0 %
Lymphocytes Relative: 50 %
Lymphs Abs: 1.5 10*3/uL (ref 0.7–4.0)
MCH: 26.4 pg (ref 26.0–34.0)
MCHC: 31.8 g/dL (ref 30.0–36.0)
MCV: 83 fL (ref 80.0–100.0)
Monocytes Absolute: 0.3 10*3/uL (ref 0.1–1.0)
Monocytes Relative: 9 %
Neutro Abs: 1.2 10*3/uL — ABNORMAL LOW (ref 1.7–7.7)
Neutrophils Relative %: 39 %
Platelet Count: 201 10*3/uL (ref 150–400)
RBC: 4.66 MIL/uL (ref 3.87–5.11)
RDW: 13 % (ref 11.5–15.5)
WBC Count: 3 10*3/uL — ABNORMAL LOW (ref 4.0–10.5)
nRBC: 0 % (ref 0.0–0.2)

## 2021-09-20 NOTE — Progress Notes (Signed)
  Alison Castillo   Diagnosis: Neutropenia  INTERVAL HISTORY:   Alison Castillo returns as scheduled.  She feels well.  Good appetite.  No fever.  She has hot flashes at night.  No recent infection.  No complaint.  She brought in a Castillo from her father's physician stating there is a history of familial neutropenia.  Her brother, sister, father, and paternal aunt all have neutropenia.  Objective:  Vital signs in last 24 hours:  Blood pressure 126/82, pulse 63, temperature 98.2 F (36.8 C), temperature source Oral, resp. rate 18, height '5\' 7"'$  (1.702 m), weight 171 lb 6.4 oz (77.7 kg), last menstrual period 03/19/2017, SpO2 100 %.    Lymphatics: No cervical, supraclavicular, or axillary nodes Resp: Lungs clear bilaterally Cardio: Regular rate and rhythm GI: No hepatosplenomegaly Vascular: No leg edema   Lab Results:  Lab Results  Component Value Date   WBC 3.0 (L) 09/20/2021   HGB 12.3 09/20/2021   HCT 38.7 09/20/2021   MCV 83.0 09/20/2021   PLT 201 09/20/2021   NEUTROABS 1.2 (L) 09/20/2021    CMP  Lab Results  Component Value Date   NA 141 09/08/2020   K 4.1 09/08/2020   CL 102 09/08/2020   CO2 21 09/08/2020   GLUCOSE 75 09/08/2020   BUN 11 09/08/2020   CREATININE 0.72 09/08/2020   CALCIUM 9.7 09/08/2020   PROT 7.5 09/08/2020   ALBUMIN 4.6 09/08/2020   AST 23 09/08/2020   ALT 13 09/08/2020   ALKPHOS 103 09/08/2020   BILITOT 0.3 09/08/2020   GFRNONAA 97 03/15/2019   GFRAA 112 03/15/2019     Medications: I have reviewed the patient's current medications.   Assessment/Plan: Mild neutropenia Total white count low dating at least to 2016  History of mild normocytic anemia Uterine fibroids History of a CVA, maintained on aspirin COVID-19 10/02/2020 Familial neutropenia    Disposition: Alison Castillo was referred for evaluation of neutropenia.  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  She will seek medical  attention for symptoms of an infection.  She will stay up-to-date on COVID-19, influenza, pneumonia vaccines.  The serum kappa free light chains were mildly elevated 03/12/2021.  A serum immunofixation was negative for monoclonal protein and the immunoglobulin levels were normal.  I think this is likely a benign finding.  I will recommend Alison Castillo repeat serum light chains within the next year.  She plans to continue clinical follow-up with Alison Castillo and Alison Castillo.  I am available to see her in the future if she develops a new hematologic abnormality.  Alison Coder, MD  09/20/2021  10:12 AM

## 2021-09-21 ENCOUNTER — Other Ambulatory Visit: Payer: Self-pay | Admitting: Obstetrics & Gynecology

## 2021-09-21 ENCOUNTER — Ambulatory Visit (INDEPENDENT_AMBULATORY_CARE_PROVIDER_SITE_OTHER): Payer: BC Managed Care – PPO | Admitting: Obstetrics & Gynecology

## 2021-09-21 ENCOUNTER — Encounter (HOSPITAL_BASED_OUTPATIENT_CLINIC_OR_DEPARTMENT_OTHER): Payer: Self-pay | Admitting: Obstetrics & Gynecology

## 2021-09-21 ENCOUNTER — Other Ambulatory Visit (HOSPITAL_COMMUNITY)
Admission: RE | Admit: 2021-09-21 | Discharge: 2021-09-21 | Disposition: A | Discharge status: Home / Self Care | Payer: BC Managed Care – PPO | Source: Ambulatory Visit | Attending: Obstetrics & Gynecology | Admitting: Obstetrics & Gynecology

## 2021-09-21 VITALS — BP 121/71 | HR 64 | Ht 67.0 in | Wt 169.8 lb

## 2021-09-21 DIAGNOSIS — Z01419 Encounter for gynecological examination (general) (routine) without abnormal findings: Secondary | ICD-10-CM

## 2021-09-21 DIAGNOSIS — I6330 Cerebral infarction due to thrombosis of unspecified cerebral artery: Secondary | ICD-10-CM | POA: Insufficient documentation

## 2021-09-21 DIAGNOSIS — Z124 Encounter for screening for malignant neoplasm of cervix: Secondary | ICD-10-CM

## 2021-09-21 DIAGNOSIS — N951 Menopausal and female climacteric states: Secondary | ICD-10-CM | POA: Diagnosis not present

## 2021-09-21 DIAGNOSIS — R3915 Urgency of urination: Secondary | ICD-10-CM

## 2021-09-21 DIAGNOSIS — D709 Neutropenia, unspecified: Secondary | ICD-10-CM

## 2021-09-21 DIAGNOSIS — Z8673 Personal history of transient ischemic attack (TIA), and cerebral infarction without residual deficits: Secondary | ICD-10-CM

## 2021-09-21 DIAGNOSIS — Z0000 Encounter for general adult medical examination without abnormal findings: Secondary | ICD-10-CM | POA: Diagnosis not present

## 2021-09-21 DIAGNOSIS — R232 Flushing: Secondary | ICD-10-CM | POA: Diagnosis not present

## 2021-09-21 DIAGNOSIS — D251 Intramural leiomyoma of uterus: Secondary | ICD-10-CM | POA: Diagnosis not present

## 2021-09-21 DIAGNOSIS — Z1231 Encounter for screening mammogram for malignant neoplasm of breast: Secondary | ICD-10-CM

## 2021-09-21 MED ORDER — OXYBUTYNIN CHLORIDE ER 5 MG PO TB24: 5.0000 mg | ORAL_TABLET | Freq: Every day | ORAL | 2 refills | Status: DC

## 2021-09-21 NOTE — Patient Instructions (Signed)
[place] -- new medication for hot flashes

## 2021-09-21 NOTE — Progress Notes (Addendum)
61 y.o. G1P0010 Single Black or [place] American female here for annual exam.  Doing well.  Does have low WBC ct.  Has been followed by Dr. Benay Spice but was released yesterday.  Felt like this is familial and pt's dad and paternal aunt have the same issues.  Follow up testing is recommended.  Discussed with pt and she voices understanding.  She is comfortable with me ordering this next year.  Denies vaginal bleeding.    Job in [place] has been a good change for him.    Patient's last menstrual period was 03/19/2017.          Sexually active: Yes.    The current method of family planning is post menopausal status.    Exercising: Yes.     walking Smoker:  no  Health Maintenance: Pap:  03/15/2019 Normal History of abnormal Pap:  no MMG:  09/14/2021 Negative Colonoscopy:  2/28/219, follow up 5 years BMD:   Plan to do next year.  She and I discussed last year.   Screening Labs: labs ordered   reports that she has never smoked. She has never used smokeless tobacco. She reports that she does not drink alcohol and does not use drugs.  Past Medical History:  Diagnosis Date   Arthritis 2018   Breast pain, left    Since 02/2015   CVA (cerebral vascular accident) ([place]) 05/2007   Galactorrhea     Past Surgical History:  Procedure Laterality Date   HYSTEROSCOPY WITH D & C  05/04/2002   with polyp resection    Current Outpatient Medications  Medication Sig Dispense Refill   acetaminophen (TYLENOL) 650 MG CR tablet Take 650 mg by mouth every 8 (eight) hours as needed for pain.     aspirin 81 MG tablet Take 81 mg by mouth daily.     Ca & Phos-Vit D-Mag (CALCIUM) [phone] TABS Take 1 tablet by mouth daily.     CYANOCOBALAMIN ER PO Take by mouth.     Multiple Vitamin (MULTIVITAMIN) capsule Take 1 capsule by mouth daily.     Pyridoxine HCl (VITAMIN B-6 PO) Take by mouth.     Turmeric Curcumin 500 MG CAPS Take 1 capsule by mouth daily. Take every other day     No current  facility-administered medications for this visit.    Family History  Problem Relation Age of Onset   Hypertension Father    Heart failure Father    Diabetes Maternal Aunt    Lung cancer Maternal Aunt    Lung cancer Mother 63   Bone cancer Sister 80   Diabetes Brother 75    ROS: Constitutional: negative Genitourinary:negative  Exam:   BP 121/71 (BP Location: Right Arm, Patient Position: Sitting, Cuff Size: Large)   Pulse 64   Ht '5\' 7"'$  (1.702 m) Comment: Reported  Wt 169 lb 12.8 oz (77 kg)   LMP 03/19/2017   BMI 26.59 kg/m   Height: '5\' 7"'$  (170.2 cm) (Reported)  General appearance: alert, cooperative and appears stated age Head: Normocephalic, without obvious abnormality, atraumatic Neck: no adenopathy, supple, symmetrical, trachea midline and thyroid normal to inspection and palpation Lungs: clear to auscultation bilaterally Breasts: normal appearance, no masses or tenderness Heart: regular rate and rhythm Abdomen: soft, non-tender; bowel sounds normal; no masses,  no organomegaly Extremities: extremities normal, atraumatic, no cyanosis or edema Skin: Skin color, texture, turgor normal. No rashes or lesions Lymph nodes: Cervical, supraclavicular, and axillary nodes normal. No abnormal inguinal nodes palpated Neurologic: Grossly normal  Pelvic: External genitalia:  no lesions              Urethra:  normal appearing urethra with no masses, tenderness or lesions              Bartholins and Skenes: normal                 Vagina: normal appearing vagina with normal color and no discharge, no lesions              Cervix: no lesions              Pap taken: Yes.   Bimanual Exam:  Uterus:  about 12 weeks, mobile              Adnexa: normal adnexa and no mass, fullness, tenderness               Rectovaginal: Confirms               Anus:  normal sphincter tone, no lesions  Chaperone, Octaviano Batty, CMA, was present for exam.  Assessment/Plan: 1. Well woman exam with routine  gynecological exam - Pap smear obtained today - Mammogram 08/2021 - Colonoscopy due early next year.  Pt aware. - Bone mineral density ordered - lab work ordered today - vaccines reviewed/updated  2. Blood tests for routine general physical examination - TSH - Lipid panel - Hemoglobin A1c  3. Cervical cancer screening - Cytology - PAP( [place])  4. Hot flashes - new medication [place] discussed  5. Intramural leiomyoma of uterus  6. History of stroke  7.  Urinary urgency - trial of oxybutynin '5mg'$  XL.  Rx to pharmacy.  Side effects discussed.  No hx of glaucoma.   8.  Neutropenia - has been followed by Dr. Benay Spice.  Released from care yesterday.  Follow up lab work in 6 - 12 months recommended.  Serum free lights chain is his recommendation.  Pt feels comfortable doing this in one year.

## 2021-09-22 LAB — LIPID PANEL
Chol/HDL Ratio: 3.2 ratio (ref 0.0–4.4)
Cholesterol, Total: 187 mg/dL (ref 100–199)
HDL: 58 mg/dL (ref >39)
LDL Chol Calc (NIH): 117 mg/dL — ABNORMAL HIGH (ref 0–99)
Triglycerides: 66 mg/dL (ref 0–149)
VLDL Cholesterol Cal: 12 mg/dL (ref 5–40)

## 2021-09-22 LAB — HEMOGLOBIN A1C
Est. average glucose Bld gHb Est-mCnc: 117 mg/dL
Hgb A1c MFr Bld: 5.7 % — ABNORMAL HIGH (ref 4.8–5.6)

## 2021-09-22 LAB — TSH: TSH: 0.719 u[IU]/mL (ref 0.450–4.500)

## 2021-09-25 LAB — CYTOLOGY - PAP
Comment: NEGATIVE
Diagnosis: NEGATIVE
High risk HPV: NEGATIVE

## 2021-09-26 ENCOUNTER — Telehealth (HOSPITAL_BASED_OUTPATIENT_CLINIC_OR_DEPARTMENT_OTHER): Payer: Self-pay | Admitting: Obstetrics & Gynecology

## 2021-09-26 NOTE — Telephone Encounter (Signed)
Patient called and stated she returning someone call.

## 2021-09-27 NOTE — Telephone Encounter (Signed)
Returned pts call and informed her of lab results.

## 2021-10-12 ENCOUNTER — Other Ambulatory Visit: Payer: Self-pay | Admitting: Obstetrics & Gynecology

## 2021-10-12 ENCOUNTER — Other Ambulatory Visit (HOSPITAL_BASED_OUTPATIENT_CLINIC_OR_DEPARTMENT_OTHER): Payer: Self-pay | Admitting: Obstetrics & Gynecology

## 2021-10-12 DIAGNOSIS — Z1231 Encounter for screening mammogram for malignant neoplasm of breast: Secondary | ICD-10-CM

## 2021-10-12 DIAGNOSIS — E2839 Other primary ovarian failure: Secondary | ICD-10-CM

## 2022-06-03 ENCOUNTER — Encounter (HOSPITAL_BASED_OUTPATIENT_CLINIC_OR_DEPARTMENT_OTHER): Payer: Self-pay | Admitting: Obstetrics & Gynecology

## 2022-06-03 ENCOUNTER — Ambulatory Visit (HOSPITAL_BASED_OUTPATIENT_CLINIC_OR_DEPARTMENT_OTHER): Payer: BC Managed Care – PPO | Admitting: Obstetrics & Gynecology

## 2022-06-03 ENCOUNTER — Encounter: Payer: Self-pay | Admitting: *Deleted

## 2022-06-03 VITALS — BP 125/69 | HR 61 | Ht 67.0 in | Wt 167.0 lb

## 2022-06-03 DIAGNOSIS — K635 Polyp of colon: Secondary | ICD-10-CM | POA: Diagnosis not present

## 2022-06-03 DIAGNOSIS — Z800 Family history of malignant neoplasm of digestive organs: Secondary | ICD-10-CM

## 2022-06-03 DIAGNOSIS — N644 Mastodynia: Secondary | ICD-10-CM | POA: Diagnosis not present

## 2022-06-03 NOTE — Progress Notes (Unsigned)
GYNECOLOGY  VISIT  CC:   breast pain x 2 weeks  HPI: 62 y.o. G1P0010 Single Black or African American female who complains of significant breast pain that has been present for two weeks.  She cannot feel a mass/lump.  Denies recent trauma.  States this is her "problem breast".  If she has a breast issues, it's always the left one.  She has decreased her caffeine and rarely drinks sodas.    Last mammogram 08/2021.   Past Medical History:  Diagnosis Date   Arthritis 2018   Breast pain, left    Since 02/2015   CVA (cerebral vascular accident) 05/2007   Galactorrhea     MEDS:   Current Outpatient Medications on File Prior to Visit  Medication Sig Dispense Refill   acetaminophen (TYLENOL) 650 MG CR tablet Take 650 mg by mouth every 8 (eight) hours as needed for pain.     aspirin 81 MG tablet Take 81 mg by mouth daily.     Ca & Phos-Vit D-Mag (CALCIUM) [phone] TABS Take 1 tablet by mouth daily.     CYANOCOBALAMIN ER PO Take by mouth.     Multiple Vitamin (MULTIVITAMIN) capsule Take 1 capsule by mouth daily.     oxybutynin (DITROPAN-XL) 5 MG 24 hr tablet Take 1 tablet (5 mg total) by mouth at bedtime. 30 tablet 2   Pyridoxine HCl (VITAMIN B-6 PO) Take by mouth.     Turmeric Curcumin 500 MG CAPS Take 1 capsule by mouth daily. Take every other day     No current facility-administered medications on file prior to visit.    ALLERGIES: Bee venom and Latex  SH:  ***  ROS  PHYSICAL EXAMINATION:    BP 125/69 (BP Location: Left Arm, Patient Position: Sitting, Cuff Size: Large)   Pulse 61   Ht 5\' 7"  (1.702 m) Comment: Reported  Wt 167 lb (75.8 kg)   LMP 03/19/2017   BMI 26.16 kg/m     General appearance: alert, cooperative and appears stated age Neck: no adenopathy, supple, symmetrical, trachea midline and thyroid {CHL AMB PHY EX THYROID NORM DEFAULT:[phone]::"normal to inspection and palpation"} CV:  {Exam; heart brief:31539} Lungs:  {pe lungs ob:314451} Breasts: {Exam;  breast:13139::"normal appearance, no masses or tenderness"} Abdomen: soft, non-tender; bowel sounds normal; no masses,  no organomegaly Lymph:  no inguinal LAD noted  Pelvic: External genitalia:  no lesions              Urethra:  normal appearing urethra with no masses, tenderness or lesions              Bartholins and Skenes: normal                 Vagina: normal appearing vagina with normal color and discharge, no lesions              Cervix: {CHL AMB PHY EX CERVIX NORM [phone]::"no lesions"}              Bimanual Exam:  Uterus:  {CHL AMB PHY EX UTERUS NORM [phone]::"normal size, contour, position, consistency, mobility, non-tender"}              Adnexa: {CHL AMB PHY EX ADNEXA NO MASS DEFAULT:[phone]::"no mass, fullness, tenderness"}              Rectovaginal: {yes no:314532}.  Confirms.              Anus:  normal sphincter tone, no lesions  Chaperone, ***, CMA, was present  for exam.  Assessment/Plan: 1. Polyp of descending colon, unspecified type *** - Ambulatory referral to Gastroenterology  2. Family history of colon cancer *** - Ambulatory referral to Gastroenterology

## 2022-06-06 DIAGNOSIS — Z800 Family history of malignant neoplasm of digestive organs: Secondary | ICD-10-CM | POA: Insufficient documentation

## 2022-06-06 DIAGNOSIS — N644 Mastodynia: Secondary | ICD-10-CM | POA: Insufficient documentation

## 2022-07-11 ENCOUNTER — Encounter (HOSPITAL_BASED_OUTPATIENT_CLINIC_OR_DEPARTMENT_OTHER): Payer: Self-pay | Admitting: *Deleted

## 2022-07-18 ENCOUNTER — Telehealth: Payer: Self-pay | Admitting: *Deleted

## 2022-07-18 NOTE — Telephone Encounter (Signed)
  Procedure: Colonoscopy  Estimated body mass index is 26.16 kg/m as calculated from the following:   Height as of 06/03/22: 5\' 7"  (1.702 m).   Weight as of 06/03/22: 167 lb (75.8 kg).   Have you had a colonoscopy before?  03/2017 (scanned in epic)  Do you have family history of colon cancer?  brother  Do you have a family history of polyps? yes  Previous colonoscopy with polyps removed? yes  Do you have a history colorectal cancer?   no  Are you diabetic?  no  Do you have a prosthetic or mechanical heart valve? no  Do you have a pacemaker/defibrillator?   no  Have you had endocarditis/atrial fibrillation?  no  Do you use supplemental oxygen/CPAP?  no  Have you had joint replacement within the last 12 months?  no  Do you tend to be constipated or have to use laxatives?  no   Do you have history of alcohol use? If yes, how much and how often.  no  Do you have history or are you using drugs? If yes, what do are you  using?  no  Have you ever had a stroke/heart attack?  stroke  Have you ever had a heart or other vascular stent placed,? no  Do you take weight loss medication? no  female patients,: have you had a hysterectomy? no                              are you post menopausal?  yes                              do you still have your menstrual cycle? no    Date of last menstrual period?   Do you take any blood-thinning medications such as: (Plavix, aspirin, Coumadin, Aggrenox, Brilinta, Xarelto, Eliquis, Pradaxa, Savaysa or Effient)? Yes aspirin 81mg   If yes we need the name, milligram, dosage and who is prescribing doctor:               Current Outpatient Medications  Medication Sig Dispense Refill   acetaminophen (TYLENOL) 650 MG CR tablet Take 650 mg by mouth every 8 (eight) hours as needed for pain.     aspirin 81 MG tablet Take 81 mg by mouth daily.     Turmeric Curcumin 500 MG CAPS Take 1 capsule by mouth daily. Take every other day     No current  facility-administered medications for this visit.    Allergies  Allergen Reactions   Bee Venom    Latex

## 2022-08-19 NOTE — Telephone Encounter (Signed)
Reported history of stroke.  Needs office visit prior to scheduling.

## 2022-08-21 NOTE — Telephone Encounter (Signed)
noted 

## 2022-10-07 ENCOUNTER — Ambulatory Visit
Admission: RE | Admit: 2022-10-07 | Discharge: 2022-10-07 | Disposition: A | Discharge status: Home / Self Care | Payer: BC Managed Care – PPO | Source: Ambulatory Visit | Attending: Obstetrics & Gynecology | Admitting: Obstetrics & Gynecology

## 2022-10-07 ENCOUNTER — Ambulatory Visit (INDEPENDENT_AMBULATORY_CARE_PROVIDER_SITE_OTHER): Payer: BC Managed Care – PPO | Admitting: Obstetrics & Gynecology

## 2022-10-07 ENCOUNTER — Encounter (HOSPITAL_BASED_OUTPATIENT_CLINIC_OR_DEPARTMENT_OTHER): Payer: Self-pay | Admitting: Obstetrics & Gynecology

## 2022-10-07 VITALS — BP 118/67 | HR 61 | Ht 67.5 in | Wt 169.2 lb

## 2022-10-07 DIAGNOSIS — Z8673 Personal history of transient ischemic attack (TIA), and cerebral infarction without residual deficits: Secondary | ICD-10-CM

## 2022-10-07 DIAGNOSIS — D251 Intramural leiomyoma of uterus: Secondary | ICD-10-CM

## 2022-10-07 DIAGNOSIS — Z1231 Encounter for screening mammogram for malignant neoplasm of breast: Secondary | ICD-10-CM

## 2022-10-07 DIAGNOSIS — Z01419 Encounter for gynecological examination (general) (routine) without abnormal findings: Secondary | ICD-10-CM | POA: Diagnosis not present

## 2022-10-07 DIAGNOSIS — Z800 Family history of malignant neoplasm of digestive organs: Secondary | ICD-10-CM

## 2022-10-07 DIAGNOSIS — R3915 Urgency of urination: Secondary | ICD-10-CM

## 2022-10-07 DIAGNOSIS — D709 Neutropenia, unspecified: Secondary | ICD-10-CM

## 2022-10-07 DIAGNOSIS — E2839 Other primary ovarian failure: Secondary | ICD-10-CM

## 2022-10-07 MED ORDER — OXYBUTYNIN CHLORIDE ER 5 MG PO TB24
5.0000 mg | ORAL_TABLET | Freq: Every day | ORAL | 12 refills | Status: DC
Start: 2022-10-07 — End: 2023-10-14

## 2022-10-07 NOTE — Patient Instructions (Signed)
Replens vaginal moisturizer -- twice weekly Vit E vaginal suppositories -- amazon twice weekly Coconut oil

## 2022-10-07 NOTE — Progress Notes (Signed)
62 y.o. G1P0010 Single Black or [place] American female here for annual exam.  Denies vaginal bleeding.  Blood work now being done by Dr. Corliss Blacker.  Does not have with her today.    Patient's last menstrual period was 03/19/2017.          Sexually active: Yes.    The current method of family planning is post menopausal status.    Exercising: walking at work Smoker:  no  Health Maintenance: Pap:  09/21/2021 History of abnormal Pap:  no MMG:  done today Colonoscopy:  2019, scheduled in November BMD:   completed today Screening Labs: done with PCP   reports that she has never smoked. She has never used smokeless tobacco. She reports that she does not drink alcohol and does not use drugs.  Past Medical History:  Diagnosis Date   Arthritis 2018   Breast pain, left    Since 02/2015   CVA (cerebral vascular accident) ([facility]) 05/2007   Galactorrhea     Past Surgical History:  Procedure Laterality Date   HYSTEROSCOPY WITH D & C  05/04/2002   with polyp resection    Current Outpatient Medications  Medication Sig Dispense Refill   acetaminophen (TYLENOL) 650 MG CR tablet Take 650 mg by mouth every 8 (eight) hours as needed for pain.     aspirin 81 MG tablet Take 81 mg by mouth daily.     Turmeric Curcumin 500 MG CAPS Take 1 capsule by mouth daily. Take every other day     No current facility-administered medications for this visit.    Family History  Problem Relation Age of Onset   Hypertension Father    Heart failure Father    Diabetes Maternal Aunt    Lung cancer Maternal Aunt    Lung cancer Mother 73   Bone cancer Sister 47   Diabetes Brother 20    ROS: Constitutional: negative Genitourinary:negative  Exam:   BP 118/67 (BP Location: Right Arm, Patient Position: Sitting, Cuff Size: Large)   Pulse 61   Ht 5' 7.5" (1.715 m) Comment: Reported  Wt 169 lb 3.2 oz (76.7 kg)   LMP 03/19/2017   BMI 26.11 kg/m   Height: 5' 7.5" (171.5 cm) (Reported)  General appearance:  alert, cooperative and appears stated age Head: Normocephalic, without obvious abnormality, atraumatic Neck: no adenopathy, supple, symmetrical, trachea midline and thyroid normal to inspection and palpation Lungs: clear to auscultation bilaterally Breasts: normal appearance, no masses or tenderness Heart: regular rate and rhythm Abdomen: soft, non-tender; bowel sounds normal; no masses,  no organomegaly Extremities: extremities normal, atraumatic, no cyanosis or edema Skin: Skin color, texture, turgor normal. No rashes or lesions Lymph nodes: Cervical, supraclavicular, and axillary nodes normal. No abnormal inguinal nodes palpated Neurologic: Grossly normal   Pelvic: External genitalia:  no lesions              Urethra:  normal appearing urethra with no masses, tenderness or lesions              Bartholins and Skenes: normal                 Vagina: normal appearing vagina with normal color and no discharge, no lesions              Cervix: no lesions              Pap taken: No. Bimanual Exam:  Uterus:  about 10 weeks size, stable, mobile, non tender  Adnexa: normal adnexa and no mass, fullness, tenderness               Rectovaginal: Confirms               Anus:  normal sphincter tone, no lesions  Chaperone, Ina Homes, CMA, was present for exam.  Assessment/Plan: 1. Well woman exam with routine gynecological exam - Pap smear 2023.  Pt desired to repeat next year. - Mammogram done today - Colonoscopy scheduled for November - Bone mineral density done today - lab work done with PCP, Dr. Gweneth Dimitri - vaccines reviewed/updated.  Has not done shinrix vaccination at this point.  2. Intramural leiomyoma of uterus  3. History of stroke - takes daily baby ASA  4. Family history of colon cancer  5. Urinary urgency - oxybutynin (DITROPAN-XL) 5 MG 24 hr tablet; Take 1 tablet (5 mg total) by mouth at bedtime.  Dispense: 30 tablet; Refill: 12    6.  H/o uterine  fibroids - stable exam  7. Neutropenia, unspecified type ([facility]) - Kappa/lambda light chains

## 2022-11-13 ENCOUNTER — Encounter: Payer: Self-pay | Admitting: Obstetrics and Gynecology

## 2022-12-03 ENCOUNTER — Encounter: Payer: Self-pay | Admitting: *Deleted

## 2023-06-18 ENCOUNTER — Other Ambulatory Visit: Payer: Self-pay | Admitting: Obstetrics & Gynecology

## 2023-06-18 DIAGNOSIS — Z1231 Encounter for screening mammogram for malignant neoplasm of breast: Secondary | ICD-10-CM

## 2023-10-14 ENCOUNTER — Encounter (HOSPITAL_BASED_OUTPATIENT_CLINIC_OR_DEPARTMENT_OTHER): Payer: Self-pay | Admitting: Obstetrics & Gynecology

## 2023-10-14 ENCOUNTER — Ambulatory Visit
Admission: RE | Admit: 2023-10-14 | Discharge: 2023-10-14 | Disposition: A | Discharge status: Home / Self Care | Payer: Self-pay | Source: Ambulatory Visit | Attending: Obstetrics & Gynecology | Admitting: Obstetrics & Gynecology

## 2023-10-14 ENCOUNTER — Other Ambulatory Visit (HOSPITAL_COMMUNITY)
Admission: RE | Admit: 2023-10-14 | Discharge: 2023-10-14 | Disposition: A | Discharge status: Home / Self Care | Source: Ambulatory Visit | Attending: Obstetrics & Gynecology | Admitting: Obstetrics & Gynecology

## 2023-10-14 ENCOUNTER — Ambulatory Visit (HOSPITAL_BASED_OUTPATIENT_CLINIC_OR_DEPARTMENT_OTHER): Payer: Self-pay | Admitting: Obstetrics & Gynecology

## 2023-10-14 VITALS — BP 130/74 | HR 59 | Ht 67.5 in | Wt 157.0 lb

## 2023-10-14 DIAGNOSIS — D251 Intramural leiomyoma of uterus: Secondary | ICD-10-CM

## 2023-10-14 DIAGNOSIS — Z124 Encounter for screening for malignant neoplasm of cervix: Secondary | ICD-10-CM

## 2023-10-14 DIAGNOSIS — E559 Vitamin D deficiency, unspecified: Secondary | ICD-10-CM | POA: Diagnosis not present

## 2023-10-14 DIAGNOSIS — Z01419 Encounter for gynecological examination (general) (routine) without abnormal findings: Secondary | ICD-10-CM

## 2023-10-14 DIAGNOSIS — Z8673 Personal history of transient ischemic attack (TIA), and cerebral infarction without residual deficits: Secondary | ICD-10-CM | POA: Diagnosis not present

## 2023-10-14 DIAGNOSIS — Z1231 Encounter for screening mammogram for malignant neoplasm of breast: Secondary | ICD-10-CM

## 2023-10-14 NOTE — Progress Notes (Signed)
 ANNUAL EXAM Patient name: Alison Castillo MRN [id number]  Date of birth: 1961/01/02 Chief Complaint:   Gynecologic Exam  History of Present Illness:   Alison Castillo is a 63 y.o. G48P0010 African-American female being seen today for a routine annual exam.  Doing well.  School year has started.  Denies vaginal bleeding.  Has urinary urgency.  She was prescribed oxybutynin  last year but she hasn't taken it and doesn't want a RF.    Saw Dr. Lenora earlier this year.  Her hbA1C is being followed closely.    Patient's last menstrual period was 03/19/2017.   Last pap 08/2021. Results were: NILM w/ HRHPV negative. H/O abnormal pap: no [facility]. Family h/o breast cancer: no Last colonoscopy: 2019.  Pt thinks she had one last year but will get this date for me.  Results were: normal. Family h/o colorectal cancer: yes, brother.   DEXA:   T score -2.0, 09/2022     10/14/2023    2:19 PM 10/07/2022    4:13 PM 06/03/2022   12:02 PM 09/21/2021    9:27 AM 09/08/2020    8:36 AM  Depression screen PHQ 2/9  Decreased Interest 0 0 0 0 0  Down, Depressed, Hopeless 0 0 0 0 0  PHQ - 2 Score 0 0 0 0 0    Review of Systems:   Pertinent items are noted in HPI Denies any vaginal bleeding, pelvic pain.  Denies constipation.   Pertinent History Reviewed:  Reviewed past medical,surgical, social and family history.  Reviewed problem list, medications and allergies. Physical Assessment:   Vitals:   10/14/23 1418  BP: 130/74  Pulse: (!) 59  SpO2: 100%  Weight: 157 lb (71.2 kg)  Height: 5' 7.5 (1.715 m)  Body mass index is 24.23 kg/m.        Physical Examination:   General appearance - well appearing, and in no distress  Mental status - alert, oriented to person, place, and time  Psych:  She has a normal mood and affect  Skin - warm and dry, normal color, no suspicious lesions noted  Chest - effort normal, all lung fields clear to auscultation bilaterally  Heart  - normal rate and regular rhythm  Neck:  midline trachea, no thyromegaly or nodules  Breasts - breasts appear normal, no suspicious masses, no skin or nipple changes or  axillary nodes  Abdomen - soft, nontender, nondistended, no masses or organomegaly  Pelvic - VULVA: normal appearing vulva with no masses, tenderness or lesions   VAGINA: normal appearing vagina with normal color and discharge, no lesions   CERVIX: normal appearing cervix without discharge or lesions, no CMT  Thin prep pap is done per pt request, no HR HPV ordered today.  UTERUS: globular 10-12 weeks size, mobile, adnexa normal  ADNEXA: No adnexal masses or tenderness noted.  Rectal - normal rectal, good sphincter tone, no masses felt.   Extremities:  No swelling or varicosities noted  Chaperone present for exam  No results found for this or any previous visit (from the past 24 hours).  Assessment & Plan:  1. Well woman exam with routine gynecological exam (Primary) - Pap smear updated today per pt request - Mammogram done today - Colonoscopy 2019.  Follow up 5 years recommended.  She thinks she had this done last year and will get me a copy of this.   - Bone mineral density 2024 - lab work done with PCP,  Dr. Lenora - vaccines reviewed/updated  2. Cervical cancer screening - Cytology - PAP( [place])  3. Intramural leiomyoma of uterus - stable exam today  4. Vitamin D  deficiency - on OTC Vit D  5. History of stroke - taking baby ASA   No orders of the defined types were placed in this encounter.   Meds: No orders of the defined types were placed in this encounter.   Follow-up: Return in about 1 year (around 10/13/2024).  Ronal GORMAN Pinal, MD 10/14/2023 3:07 PM

## 2023-10-17 LAB — CYTOLOGY - PAP: Diagnosis: NEGATIVE

## 2023-10-23 ENCOUNTER — Ambulatory Visit (HOSPITAL_BASED_OUTPATIENT_CLINIC_OR_DEPARTMENT_OTHER): Payer: Self-pay | Admitting: Obstetrics & Gynecology

## 2023-10-23 ENCOUNTER — Telehealth (HOSPITAL_BASED_OUTPATIENT_CLINIC_OR_DEPARTMENT_OTHER): Payer: Self-pay

## 2023-10-23 NOTE — Telephone Encounter (Signed)
 FYI--Called patient. DOB verified. Informed patient of lab results and recommendations. Patient expresses understanding. Patient also wanted to let Dr. Cleotilde know that she spoke with Dr. Jones office and they will be uploading her colonoscopy for view. tbw

## 2023-10-31 ENCOUNTER — Other Ambulatory Visit: Payer: Self-pay | Admitting: Obstetrics & Gynecology

## 2023-10-31 DIAGNOSIS — Z1231 Encounter for screening mammogram for malignant neoplasm of breast: Secondary | ICD-10-CM

## 2024-02-23 NOTE — Progress Notes (Signed)
" °  Cardiology Office Note:   Date:  02/23/2024  ID:  Alison Castillo, DOB 25-Dec-1960, MRN [id number] PCP: Aisha Harvey, MD  [facility] Providers Cardiologist:  None { Chief Complaint: No chief complaint on file.     History of Present Illness:   Alison Castillo is a 63 y.o. female with a PMH of R MCA CVA (2009), migraines who presents as a new patient referral by Dr. Harvey Cable for evaluation of chest pain.   Past Medical History:  Diagnosis Date   Arthritis 2018   Breast pain, left    Since 02/2015   CVA (cerebral vascular accident) ([facility]) 05/2007   Galactorrhea      Studies Reviewed:    EKG: ***           Risk Assessment/Calculations:   {Does this patient have ATRIAL FIBRILLATION?:[id number]} No BP recorded.  {Refresh Note OR Click here to enter BP  :1}***        Physical Exam:     VS:  LMP 03/19/2017  ***    Wt Readings from Last 3 Encounters:  10/14/23 157 lb (71.2 kg)  10/07/22 169 lb 3.2 oz (76.7 kg)  06/03/22 167 lb (75.8 kg)     GEN: Well nourished, well developed, in no acute distress NECK: No JVD; No carotid bruits CARDIAC: ***RRR, no murmurs, rubs, gallops RESPIRATORY:  Clear to auscultation without rales, wheezing or rhonchi  ABDOMEN: Soft, non-tender, non-distended, normal bowel sounds EXTREMITIES:  Warm and well perfused, no edema; No deformity, 2+ radial pulses PSYCH: Normal mood and affect   Assessment & Plan       {Are you ordering a CV Procedure (e.g. stress test, cath, DCCV, TEE, etc)?   Press F2        :[id number]}   This note was written with the assistance of a dictation microphone or AI dictation software. Please excuse any typos or grammatical errors.   Signed, Georganna Archer, MD 02/23/2024 8:57 PM    [facility]  "

## 2024-02-24 ENCOUNTER — Encounter: Payer: Self-pay | Admitting: Student in an Organized Health Care Education/Training Program

## 2024-02-24 ENCOUNTER — Ambulatory Visit
Attending: Student in an Organized Health Care Education/Training Program | Admitting: Student in an Organized Health Care Education/Training Program

## 2024-02-24 VITALS — BP 130/70 | HR 58 | Ht 67.0 in | Wt 160.0 lb

## 2024-02-24 DIAGNOSIS — R072 Precordial pain: Secondary | ICD-10-CM

## 2024-02-24 DIAGNOSIS — Z13220 Encounter for screening for lipoid disorders: Secondary | ICD-10-CM | POA: Diagnosis not present

## 2024-02-24 DIAGNOSIS — R079 Chest pain, unspecified: Secondary | ICD-10-CM | POA: Diagnosis not present

## 2024-02-24 LAB — BASIC METABOLIC PANEL WITH GFR
BUN/Creatinine Ratio: 14 (ref 12–28)
BUN: 11 mg/dL (ref 8–27)
CO2: 22 mmol/L (ref 20–29)
Calcium: 9.8 mg/dL (ref 8.7–10.3)
Chloride: 102 mmol/L (ref 96–106)
Creatinine, Ser: 0.81 mg/dL (ref 0.57–1.00)
Glucose: 76 mg/dL (ref 70–99)
Potassium: 3.7 mmol/L (ref 3.5–5.2)
Sodium: 142 mmol/L (ref 134–144)
eGFR: 82 mL/min/1.73

## 2024-02-24 LAB — LIPID PANEL
Chol/HDL Ratio: 3.2 ratio (ref 0.0–4.4)
Cholesterol, Total: 219 mg/dL — ABNORMAL HIGH (ref 100–199)
HDL: 69 mg/dL
LDL Chol Calc (NIH): 138 mg/dL — ABNORMAL HIGH (ref 0–99)
Triglycerides: 67 mg/dL (ref 0–149)
VLDL Cholesterol Cal: 12 mg/dL (ref 5–40)

## 2024-02-24 NOTE — Patient Instructions (Addendum)
 " Lab Work: [facility] LIPID PANEL   If you have labs (blood work) drawn today and your tests are completely normal, you will receive your results only by: MyChart Message (if you have MyChart) OR A paper copy in the mail If you have any lab test that is abnormal or we need to change your treatment, we will call you to review the results.  Testing/Procedures: ECHOCARDIOGRAM   Your physician has requested that you have an echocardiogram. Echocardiography is a painless test that uses sound waves to create images of your heart. It provides your doctor with information about the size and shape of your heart and how well your hearts chambers and valves are working. This procedure takes approximately one hour. There are no restrictions for this procedure. Please do NOT wear cologne, perfume, aftershave, or lotions (deodorant is allowed). Please arrive 15 minutes prior to your appointment time.  Please note: We ask at that you not bring children with you during ultrasound (echo/ vascular) testing. Due to room size and safety concerns, children are not allowed in the ultrasound rooms during exams. Our front office staff cannot provide observation of children in our lobby area while testing is being conducted. An adult accompanying a patient to their appointment will only be allowed in the ultrasound room at the discretion of the ultrasound technician under special circumstances. We apologize for any inconvenience.   CORONARY CTA     Your cardiac CT will be scheduled at one of the below locations:   [facility] [address]  [place], [place] [address]  If scheduled at the [facility] at [address], please enter the parking lot using the [address] entrance and use the FREE valet service at the patient drop-off area. Enter the building and check-in with registration on the main floor.  Please follow these instructions carefully (unless otherwise  directed):  An IV will be required for this test and Nitroglycerin will be given.  Hold all erectile dysfunction medications at least 3 days (72 hrs) prior to test. (Ie viagra, cialis, sildenafil, tadalafil, etc)   On the Night Before the Test: Be sure to Drink plenty of water. Do not consume any caffeinated/decaffeinated beverages or chocolate 12 hours prior to your test. Do not take any antihistamines 12 hours prior to your test.  On the Day of the Test: Drink plenty of water until 1 hour prior to the test. Do not eat any food 1 hour prior to test. You may take your regular medications prior to the test.  If you take Furosemide/Hydrochlorothiazide/Spironolactone/Chlorthalidone, please HOLD on the morning of the test. Patients who wear a continuous glucose monitor MUST remove the device prior to scanning. FEMALES- please wear underwire-free bra if available, avoid dresses & tight clothing  After the Test: Drink plenty of water. After receiving IV contrast, you may experience a mild flushed feeling. This is normal. On occasion, you may experience a mild rash up to 24 hours after the test. This is not dangerous. If this occurs, you can take Benadryl 25 mg, Zyrtec, Claritin, or Allegra and increase your fluid intake. (Patients taking Tikosyn should avoid Benadryl, and may take Zyrtec, Claritin, or Allegra) If you experience trouble breathing, this can be serious. If it is severe call 911 IMMEDIATELY. If it is mild, please call our office.  We will call to schedule your test 2-4 weeks out understanding that some insurance companies will need an authorization prior to the service being  performed.   For more information and frequently asked questions, please visit our website : [email or web address]  For non-scheduling related questions, please contact the cardiac imaging nurse navigator should you have any questions/concerns: Cardiac Imaging Nurse Navigators Direct Office Dial:  [phone]   For scheduling needs, including cancellations and rescheduling, please call Brittany, [phone].   Follow-Up: At [facility], you and your health needs are our priority.  As part of our continuing mission to provide you with exceptional heart care, our providers are all part of one team.  This team includes your primary Cardiologist (physician) and Advanced Practice Providers or APPs (Physician Assistants and Nurse Practitioners) who all work together to provide you with the care you need, when you need it.  Your next appointment:   TO BE DETERMINED AFTER TESTING   Provider:   Georganna Archer, MD            "

## 2024-02-25 ENCOUNTER — Ambulatory Visit: Payer: Self-pay | Admitting: Student in an Organized Health Care Education/Training Program

## 2024-04-12 ENCOUNTER — Ambulatory Visit (HOSPITAL_COMMUNITY)

## 2024-04-12 ENCOUNTER — Other Ambulatory Visit (HOSPITAL_COMMUNITY)

## 2024-04-16 ENCOUNTER — Ambulatory Visit (HOSPITAL_COMMUNITY)

## 2024-10-14 ENCOUNTER — Ambulatory Visit (HOSPITAL_BASED_OUTPATIENT_CLINIC_OR_DEPARTMENT_OTHER): Admitting: Obstetrics & Gynecology

## 2024-10-14 ENCOUNTER — Ambulatory Visit
